# Patient Record
Sex: Female | Born: 1965 | Race: White | Hispanic: No | Marital: Married | Smoking: Never smoker
Health system: Southern US, Community
[De-identification: ages and names within clinical notes are randomized; demographics above are authoritative.]

## PROBLEM LIST (undated history)

## (undated) DIAGNOSIS — R569 Unspecified convulsions: Secondary | ICD-10-CM

## (undated) DIAGNOSIS — S069XAA Unspecified intracranial injury with loss of consciousness status unknown, initial encounter: Secondary | ICD-10-CM

## (undated) HISTORY — DX: Unspecified convulsions: R56.9

## (undated) HISTORY — PX: ABDOMINAL HYSTERECTOMY: SHX81

---
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
## 2000-02-27 ENCOUNTER — Other Ambulatory Visit: Admission: RE | Admit: 2000-02-27 | Discharge: 2000-02-27 | Payer: Self-pay | Admitting: Obstetrics and Gynecology

## 2001-03-11 ENCOUNTER — Other Ambulatory Visit: Admission: RE | Admit: 2001-03-11 | Discharge: 2001-03-11 | Payer: Self-pay | Admitting: Obstetrics and Gynecology

## 2007-02-09 ENCOUNTER — Encounter: Admission: RE | Admit: 2007-02-09 | Discharge: 2007-02-09 | Payer: Self-pay | Admitting: Obstetrics and Gynecology

## 2010-07-22 ENCOUNTER — Encounter: Payer: Self-pay | Admitting: Obstetrics and Gynecology

## 2014-11-16 DIAGNOSIS — R1013 Epigastric pain: Secondary | ICD-10-CM | POA: Insufficient documentation

## 2014-11-16 DIAGNOSIS — R1030 Lower abdominal pain, unspecified: Secondary | ICD-10-CM | POA: Insufficient documentation

## 2015-02-06 DIAGNOSIS — Z79899 Other long term (current) drug therapy: Secondary | ICD-10-CM | POA: Insufficient documentation

## 2015-02-06 DIAGNOSIS — K219 Gastro-esophageal reflux disease without esophagitis: Secondary | ICD-10-CM | POA: Insufficient documentation

## 2017-02-03 DIAGNOSIS — M20012 Mallet finger of left finger(s): Secondary | ICD-10-CM | POA: Insufficient documentation

## 2017-02-03 DIAGNOSIS — G5603 Carpal tunnel syndrome, bilateral upper limbs: Secondary | ICD-10-CM | POA: Insufficient documentation

## 2017-03-30 ENCOUNTER — Encounter: Payer: Self-pay | Admitting: Podiatry

## 2017-03-30 ENCOUNTER — Encounter (INDEPENDENT_AMBULATORY_CARE_PROVIDER_SITE_OTHER): Payer: Self-pay

## 2017-03-30 ENCOUNTER — Ambulatory Visit (INDEPENDENT_AMBULATORY_CARE_PROVIDER_SITE_OTHER): Payer: 59

## 2017-03-30 ENCOUNTER — Ambulatory Visit (INDEPENDENT_AMBULATORY_CARE_PROVIDER_SITE_OTHER): Payer: 59 | Admitting: Podiatry

## 2017-03-30 VITALS — BP 104/74 | HR 84 | Ht 66.0 in | Wt 220.0 lb

## 2017-03-30 DIAGNOSIS — M722 Plantar fascial fibromatosis: Secondary | ICD-10-CM

## 2017-03-30 NOTE — Progress Notes (Signed)
   Subjective:    Patient ID: Alexis Woods, female    DOB: 02-19-1966, 51 y.o.   MRN: [id number]  HPI  Right arch pain since July.   51 y.o. female presents with the above complaint. Reports pain with the R heel/arch worst in the AM and after periods of inactivity. Pain described as soreness. Denies other pedal issues.  No past medical history on file. No past surgical history on file.  Current Outpatient Prescriptions:  .  B Complex Vitamins (VITAMIN-B COMPLEX) TABS, Take by mouth., Disp: , Rfl:  .  ferrous sulfate 325 (65 FE) MG tablet, Take 325 mg by mouth., Disp: , Rfl:  .  omeprazole (PRILOSEC) 10 MG capsule, Take 10 mg by mouth., Disp: , Rfl:  .  fluconazole (DIFLUCAN) 150 MG tablet, Take 1 tablet (150 mg total) by mouth once a week., Disp: 6 tablet, Rfl: 1 .  meloxicam (MOBIC) 15 MG tablet, Take 1 tablet (15 mg total) by mouth daily., Disp: 30 tablet, Rfl: 0  Allergies  Allergen Reactions  . Sertraline Nausea And Vomiting     Review of Systems  Musculoskeletal: Positive for arthralgias.  All other systems reviewed and are negative.      Objective:   Physical Exam Vitals:   03/30/17 1627  BP: 104/74  Pulse: 84   General AA&O x3. Normal mood and affect.  Vascular Dorsalis pedis and posterior tibial pulses  present 2+ bilaterally  Capillary refill normal to all digits. Pedal hair growth normal.  Neurologic Epicritic sensation grossly present bilaterally.  Dermatologic No open lesions. Interspaces clear of maceration. Nails well groomed and normal in appearance.  Orthopedic: MMT 5/5 in dorsiflexion, plantarflexion, inversion, and eversion bilaterally. Tender to palpation at the calcaneal tuber right. No pain with calcaneal squeeze right. Ankle ROM diminished range of motion right. Silfverskiold Test: negative right.   Radiographs: Taken and reviewed. No acute fractures. No evidence of calcaneal stress fracture.    Assessment & Plan:  Patient was evaluated and  treated and all questions answered.  Plantar Fasciitis, right - XR reviewed as above.  - Educated on icing and stretching. Instructions given.  - Injection consisting of 1cc 0.5 % Marcaine plain, delivered to the plantar fascia. - Rx Meloxicam - Will consider orthotics if pain fails to improve.  Procedure: Injection Tendon/Ligament Location: Right plantar fascia at the glabrous junction; medial approach. Skin Prep: Alcohol. Injectate: 1 cc 0.5% marcaine plain, 1 cc dexamethasone phosphate, 0.5 cc kenalog 10. Disposition: Patient tolerated procedure well. Injection site dressed with a band-aid.

## 2017-04-06 ENCOUNTER — Ambulatory Visit
Admission: RE | Admit: 2017-04-06 | Discharge: 2017-04-06 | Disposition: A | Discharge status: Home / Self Care | Payer: Self-pay | Source: Ambulatory Visit | Attending: Physician Assistant | Admitting: Physician Assistant

## 2017-04-06 ENCOUNTER — Telehealth: Payer: Self-pay | Admitting: Podiatry

## 2017-04-06 ENCOUNTER — Other Ambulatory Visit: Payer: Self-pay | Admitting: Physician Assistant

## 2017-04-06 DIAGNOSIS — R1084 Generalized abdominal pain: Secondary | ICD-10-CM

## 2017-04-06 DIAGNOSIS — K5900 Constipation, unspecified: Secondary | ICD-10-CM

## 2017-04-06 MED ORDER — MELOXICAM 15 MG PO TABS: 15.0000 mg | ORAL_TABLET | Freq: Every day | ORAL | 0 refills | Status: DC

## 2017-04-06 MED ORDER — FLUCONAZOLE 150 MG PO TABS: 150.0000 mg | ORAL_TABLET | ORAL | 1 refills | Status: DC

## 2017-04-06 NOTE — Telephone Encounter (Signed)
I saw Dr. Samuella Cota last Monday 01 October in the [place] office. He was supposed to prescribe some medications and none of those were sent to [facility] pharmacy. I checked with them twice and they never had anything. One is supposed to be for athlete's foot, something to take orally for toenail fungus, and the important thing is for the inflammation for my plantar fasciitis. If it could get called in I can go ahead and keep my appointment for two and a half weeks. You can reach me at [phone]. Thanks. Bye.

## 2017-04-06 NOTE — Telephone Encounter (Signed)
Called patient and apologized on Dr Price's behalf let her know she should be able to go pick up her prescriptions now.  Asked her to call me back if she had any additional questions or concerns.

## 2017-04-06 NOTE — Addendum Note (Signed)
Addended by: Ventura Sellers on: 04/06/2017 11:43 AM   Modules accepted: Orders

## 2017-04-06 NOTE — Telephone Encounter (Signed)
Medications that were prescribed at last visit were never received at the pharmacy and she never received the stretch information that you discussed with her.  Meds can be called in to [facility].

## 2017-04-21 ENCOUNTER — Ambulatory Visit: Payer: 59 | Admitting: Podiatry

## 2017-05-05 ENCOUNTER — Encounter (INDEPENDENT_AMBULATORY_CARE_PROVIDER_SITE_OTHER): Payer: Self-pay

## 2017-05-05 ENCOUNTER — Ambulatory Visit (INDEPENDENT_AMBULATORY_CARE_PROVIDER_SITE_OTHER): Payer: 59 | Admitting: Podiatry

## 2017-05-05 ENCOUNTER — Encounter: Payer: Self-pay | Admitting: Podiatry

## 2017-05-05 DIAGNOSIS — M722 Plantar fascial fibromatosis: Secondary | ICD-10-CM

## 2017-05-05 NOTE — Progress Notes (Signed)
  Subjective:  Patient ID: Alexis Woods, female    DOB: 09/10/1965,  MRN: [id number]  Chief Complaint  Patient presents with  . Plantar Fasciitis    right foot feels some what better. Still hurts at the end of the day. Icing has helped a lot.   51 y.o. female returns for the above complaint.  States that she still hurts somewhat at the end of the day.  Icing has helped a lot and she ices at the end of the day.  Still wearing her plantar fascial brace.  Objective:  There were no vitals filed for this visit. General AA&O x3. Normal mood and affect.  Vascular Pedal pulses palpable.  Neurologic Epicritic sensation grossly intact.  Dermatologic No open lesions. Skin normal texture and turgor.  Orthopedic: Light tenderness to palpation R foot.   Assessment & Plan:  Patient was evaluated and treated and all questions answered.  Plantar fasciitis, R -Almost resolved. Pain only 1/10 today. -No injection today. -Scanned for orthotics; checked with insurance - won't be covered. -Will f/u in 4 weeks, if pain persists will plan for fabrication of custom orthotics.  Return in about 4 weeks (around 06/02/2017) for Plantar fasciitis.

## 2017-06-16 ENCOUNTER — Ambulatory Visit: Payer: 59 | Admitting: Podiatry

## 2017-09-21 ENCOUNTER — Other Ambulatory Visit: Payer: Self-pay | Admitting: Physician Assistant

## 2017-09-21 DIAGNOSIS — R1011 Right upper quadrant pain: Secondary | ICD-10-CM

## 2017-09-21 DIAGNOSIS — R1012 Left upper quadrant pain: Secondary | ICD-10-CM

## 2017-09-21 DIAGNOSIS — R1013 Epigastric pain: Secondary | ICD-10-CM

## 2017-09-29 ENCOUNTER — Ambulatory Visit
Admission: RE | Admit: 2017-09-29 | Discharge: 2017-09-29 | Disposition: A | Discharge status: Home / Self Care | Payer: 59 | Source: Ambulatory Visit | Attending: Physician Assistant | Admitting: Physician Assistant

## 2017-09-29 DIAGNOSIS — R1013 Epigastric pain: Secondary | ICD-10-CM

## 2017-09-29 DIAGNOSIS — R1011 Right upper quadrant pain: Secondary | ICD-10-CM

## 2017-09-29 DIAGNOSIS — R1012 Left upper quadrant pain: Secondary | ICD-10-CM

## 2017-10-07 ENCOUNTER — Other Ambulatory Visit (HOSPITAL_COMMUNITY): Payer: Self-pay | Admitting: Physician Assistant

## 2017-10-07 DIAGNOSIS — R1011 Right upper quadrant pain: Secondary | ICD-10-CM

## 2017-10-07 DIAGNOSIS — R1013 Epigastric pain: Secondary | ICD-10-CM

## 2017-10-07 DIAGNOSIS — R112 Nausea with vomiting, unspecified: Secondary | ICD-10-CM

## 2017-10-07 DIAGNOSIS — R634 Abnormal weight loss: Secondary | ICD-10-CM

## 2017-10-09 ENCOUNTER — Ambulatory Visit (HOSPITAL_BASED_OUTPATIENT_CLINIC_OR_DEPARTMENT_OTHER)
Admission: RE | Admit: 2017-10-09 | Discharge: 2017-10-09 | Disposition: A | Discharge status: Home / Self Care | Payer: 59 | Source: Ambulatory Visit | Attending: Physician Assistant | Admitting: Physician Assistant

## 2017-10-09 ENCOUNTER — Encounter (HOSPITAL_BASED_OUTPATIENT_CLINIC_OR_DEPARTMENT_OTHER): Payer: Self-pay

## 2017-10-09 DIAGNOSIS — R112 Nausea with vomiting, unspecified: Secondary | ICD-10-CM | POA: Diagnosis present

## 2017-10-09 DIAGNOSIS — K5790 Diverticulosis of intestine, part unspecified, without perforation or abscess without bleeding: Secondary | ICD-10-CM | POA: Insufficient documentation

## 2017-10-09 DIAGNOSIS — R634 Abnormal weight loss: Secondary | ICD-10-CM | POA: Diagnosis not present

## 2017-10-09 DIAGNOSIS — R1011 Right upper quadrant pain: Secondary | ICD-10-CM | POA: Diagnosis present

## 2017-10-09 DIAGNOSIS — R1013 Epigastric pain: Secondary | ICD-10-CM | POA: Diagnosis present

## 2017-10-09 MED ORDER — IOPAMIDOL (ISOVUE-300) INJECTION 61%
100.0000 mL | Freq: Once | INTRAVENOUS | Status: AC | PRN
  Administered 2017-10-09: 100 mL via INTRAVENOUS

## 2017-10-16 ENCOUNTER — Encounter (HOSPITAL_COMMUNITY)
Admission: RE | Admit: 2017-10-16 | Discharge: 2017-10-16 | Disposition: A | Discharge status: Home / Self Care | Payer: 59 | Source: Ambulatory Visit | Attending: Physician Assistant | Admitting: Physician Assistant

## 2017-10-16 DIAGNOSIS — R1011 Right upper quadrant pain: Secondary | ICD-10-CM | POA: Diagnosis present

## 2017-10-16 DIAGNOSIS — R112 Nausea with vomiting, unspecified: Secondary | ICD-10-CM | POA: Diagnosis present

## 2017-10-16 MED ORDER — TECHNETIUM TC 99M MEBROFENIN IV KIT
5.1000 | PACK | Freq: Once | INTRAVENOUS | Status: AC | PRN
  Administered 2017-10-16: 5.1 via INTRAVENOUS

## 2019-08-01 ENCOUNTER — Other Ambulatory Visit: Payer: Self-pay

## 2019-08-01 ENCOUNTER — Encounter: Payer: Self-pay | Admitting: Podiatry

## 2019-08-01 ENCOUNTER — Ambulatory Visit (INDEPENDENT_AMBULATORY_CARE_PROVIDER_SITE_OTHER): Payer: 59 | Admitting: Podiatry

## 2019-08-01 DIAGNOSIS — B351 Tinea unguium: Secondary | ICD-10-CM | POA: Diagnosis not present

## 2019-08-01 DIAGNOSIS — M722 Plantar fascial fibromatosis: Secondary | ICD-10-CM

## 2019-08-01 DIAGNOSIS — Z79899 Other long term (current) drug therapy: Secondary | ICD-10-CM | POA: Diagnosis not present

## 2019-08-01 DIAGNOSIS — K760 Fatty (change of) liver, not elsewhere classified: Secondary | ICD-10-CM | POA: Insufficient documentation

## 2019-08-01 MED ORDER — TERBINAFINE HCL 250 MG PO TABS: 250.0000 mg | ORAL_TABLET | Freq: Every day | ORAL | 2 refills | Status: DC

## 2019-08-01 NOTE — Progress Notes (Signed)
  Subjective:  Patient ID: Alexis Woods, female    DOB: [birth year],  MRN: [id number]  Chief Complaint  Patient presents with  . Nail Problem    BL toeniasl (1-2 and 4th-5th) thick , disolored  x 4-5 years    54 y.o. female presents with the above complaint. History confirmed with patient.   Objective:  Physical Exam: warm, good capillary refill, nail exam onychomycosis of the toenails, no trophic changes or ulcerative lesions, normal DP and PT pulses and normal sensory exam.  Assessment:   1. Onychomycosis   2. Encounter for long-term (current) use of high-risk medication    Plan:  Patient was evaluated and treated and all questions answered.  Onychomycosis -Educated on etiology of nail fungus. -Baseline liver function studies ordered. Will d/c terbinafine if elevated during therapy. -eRx for oral terbinafine #30. Educated on risks and benefits of the medication.  Plantar Fasciitis -Casted for CMOs  Return in about 5 weeks (around 09/05/2019) for Nail Fungus.

## 2019-08-02 LAB — HEPATIC FUNCTION PANEL
ALT: 30 IU/L (ref 0–32)
AST: 35 IU/L (ref 0–40)
Albumin: 4.8 g/dL (ref 3.8–4.9)
Alkaline Phosphatase: 86 IU/L (ref 39–117)
Bilirubin Total: 0.4 mg/dL (ref 0.0–1.2)
Bilirubin, Direct: 0.1 mg/dL (ref 0.00–0.40)
Total Protein: 7.2 g/dL (ref 6.0–8.5)

## 2019-08-05 ENCOUNTER — Telehealth: Payer: Self-pay | Admitting: Podiatry

## 2019-08-05 NOTE — Telephone Encounter (Signed)
Discussed orthotics coverage and opt is aware and is wanting to proceed with the orthotics

## 2019-08-05 NOTE — Telephone Encounter (Signed)
Left message for pt to call to discuss orthotic coverage. 

## 2019-08-11 NOTE — Telephone Encounter (Signed)
Orthotics scanned submitted to [facility]

## 2019-09-05 ENCOUNTER — Ambulatory Visit (INDEPENDENT_AMBULATORY_CARE_PROVIDER_SITE_OTHER): Payer: 59 | Admitting: Podiatry

## 2019-09-05 DIAGNOSIS — Z5329 Procedure and treatment not carried out because of patient's decision for other reasons: Secondary | ICD-10-CM

## 2019-09-05 NOTE — Progress Notes (Signed)
No show for appt. 

## 2019-10-31 ENCOUNTER — Telehealth: Payer: Self-pay

## 2019-10-31 NOTE — Telephone Encounter (Signed)
Pt called stating she had received her covid shot weeks ago but started experiencing a lot of health issues, hip pain and diarrhea. Pt states she would like to DC the oral terbinafine for right now and if she can get a liver blood order, because she is worried about any liver disfunction/problem. Please advice

## 2019-11-14 ENCOUNTER — Ambulatory Visit (INDEPENDENT_AMBULATORY_CARE_PROVIDER_SITE_OTHER): Payer: 59 | Admitting: Podiatry

## 2019-11-14 ENCOUNTER — Encounter: Payer: Self-pay | Admitting: Podiatry

## 2019-11-14 ENCOUNTER — Other Ambulatory Visit: Payer: Self-pay

## 2019-11-14 DIAGNOSIS — B351 Tinea unguium: Secondary | ICD-10-CM | POA: Diagnosis not present

## 2019-11-14 DIAGNOSIS — M722 Plantar fascial fibromatosis: Secondary | ICD-10-CM

## 2019-11-14 MED ORDER — FLUCONAZOLE 150 MG PO TABS: 150.0000 mg | ORAL_TABLET | ORAL | 1 refills | Status: DC

## 2019-11-14 NOTE — Progress Notes (Signed)
  Subjective:  Patient ID: Alexis Woods, female    DOB: [birth year],  MRN: [id number]  Chief Complaint  Patient presents with  . Nail Problem    the nails are doing ok and i would have liked them to be further along and i want to talk about laser   54 y.o. female presents with the above complaint. History confirmed with patient.   Objective:  Physical Exam: warm, good capillary refill, nail exam onychomycosis of the toenails, no trophic changes or ulcerative lesions, normal DP and PT pulses and normal sensory exam.  Assessment:   1. Onychomycosis   2. Plantar fasciitis    Plan:  Patient was evaluated and treated and all questions answered.  Onychomycosis -Switch to fluoconazole for failure of therapy. -Trial laser.  Plantar Fasciitis -CMOs dispensed. Trimmed to fit shoes.  Return in about 2 months (around 01/14/2020) for Nail Fungus.

## 2019-11-15 DIAGNOSIS — M5431 Sciatica, right side: Secondary | ICD-10-CM | POA: Insufficient documentation

## 2019-11-24 ENCOUNTER — Other Ambulatory Visit: Payer: Self-pay

## 2019-11-24 MED ORDER — TERBINAFINE HCL 250 MG PO TABS: 250.0000 mg | ORAL_TABLET | Freq: Every day | ORAL | 2 refills | Status: DC

## 2019-12-12 ENCOUNTER — Other Ambulatory Visit: Payer: 59

## 2020-01-16 ENCOUNTER — Ambulatory Visit: Payer: 59 | Admitting: Podiatry

## 2020-01-23 ENCOUNTER — Other Ambulatory Visit: Payer: 59

## 2020-01-23 ENCOUNTER — Ambulatory Visit (INDEPENDENT_AMBULATORY_CARE_PROVIDER_SITE_OTHER): Payer: 59 | Admitting: Podiatry

## 2020-01-23 DIAGNOSIS — Z5329 Procedure and treatment not carried out because of patient's decision for other reasons: Secondary | ICD-10-CM

## 2020-01-23 NOTE — Progress Notes (Signed)
No show for appt. 

## 2020-02-27 DIAGNOSIS — R55 Syncope and collapse: Secondary | ICD-10-CM

## 2020-03-16 ENCOUNTER — Other Ambulatory Visit: Payer: Self-pay | Admitting: Neurology

## 2020-03-16 ENCOUNTER — Encounter: Payer: Self-pay | Admitting: Neurology

## 2020-03-19 ENCOUNTER — Other Ambulatory Visit: Payer: Self-pay

## 2020-03-19 ENCOUNTER — Ambulatory Visit: Payer: 59 | Admitting: Neurology

## 2020-03-19 ENCOUNTER — Encounter: Payer: Self-pay | Admitting: Neurology

## 2020-03-19 VITALS — BP 99/69 | HR 45 | Ht 66.0 in | Wt 151.0 lb

## 2020-03-19 DIAGNOSIS — S069X2A Unspecified intracranial injury with loss of consciousness of 31 minutes to 59 minutes, initial encounter: Secondary | ICD-10-CM | POA: Diagnosis not present

## 2020-03-19 DIAGNOSIS — G44301 Post-traumatic headache, unspecified, intractable: Secondary | ICD-10-CM

## 2020-03-19 DIAGNOSIS — S0993XA Unspecified injury of face, initial encounter: Secondary | ICD-10-CM

## 2020-03-19 DIAGNOSIS — R412 Retrograde amnesia: Secondary | ICD-10-CM | POA: Diagnosis not present

## 2020-03-19 DIAGNOSIS — S069XAA Unspecified intracranial injury with loss of consciousness status unknown, initial encounter: Secondary | ICD-10-CM | POA: Insufficient documentation

## 2020-03-19 DIAGNOSIS — R561 Post traumatic seizures: Secondary | ICD-10-CM | POA: Insufficient documentation

## 2020-03-19 MED ORDER — GABAPENTIN 100 MG PO CAPS: 100.0000 mg | ORAL_CAPSULE | Freq: Two times a day (BID) | ORAL | 5 refills | Status: DC

## 2020-03-19 MED ORDER — LEVETIRACETAM 500 MG PO TABS: 500.0000 mg | ORAL_TABLET | Freq: Two times a day (BID) | ORAL | 5 refills | Status: DC

## 2020-03-19 MED ORDER — DONEPEZIL HCL 5 MG PO TABS: 5.0000 mg | ORAL_TABLET | Freq: Every day | ORAL | 0 refills | Status: DC

## 2020-03-19 NOTE — Progress Notes (Signed)
Provider:  Melvyn Novasarmen  Alexis Yeatman, MD  Primary Care Physician:  Everlean CherryWhyte, Thomas M, MD [address] [place] [id number]     Referring Provider: Everlean CherryWhyte, Thomas M, Md [address] [place] [id number]          Chief Complaint according to patient   Patient presents with:    . New Patient (Initial Visit)     pt with, rm 10. sun 8/29 she was walking in [employer] and someone walked out the same time and witness she fell and hit her head, + LOC and was admitted to hospital til 9/2 and dc home. Sat 9/4 11:30 pm sz in bed and had another one in ER at check in. she was dc on keppra at that time. prior to this never had a hx of sz      Other only thing she can think of that was new leading up is she initially started a Ryze mushroom coffee, which has no caffeine         HISTORY OF PRESENT ILLNESS:  Alexis Woods is a [age]. year old  Caucasian female patient seen here as a referral on 03/19/2020 from Dr. Arlan Woods, Thomas, MD, [facility]  .  Chief concern according to patient :   Mrs. Alexis PeersYork went to a store in her usual good health on Sunday, 29 August and she has no memory but occurred next but she was witnessed to have fallen and hit her head.  A smart phone picture that her husband took shows that her right face is very bruised and a braised and she also reports that she had not all over her scalp the right face and right scalp were very tender.  She was admitted to the hospital and stayed there until 2 September then discharged home. She had broken her nose.  She remained with  retrograde amnesia patchy memory and difficulties retaining new memory.  Her on Friday following she had projectile vomiting at home only and and by Saturday, 4 September at about 11:30 PM she suffered a seizure in bed which was witnessed by her husband. Returning to the Emergency room.   She was returning to the emergency room where she had another witnessed seizure- this time she was started  on Keppra.  Prior to her head injury she never had suffered a seizure.  There is no family history of epilepsy.  She has no previous  history of any traumatic brain injuries. She recently has had a UTI, and she has been drinking mushroom coffee. She was sleep derived for planning a trip.     I have the pleasure of seeing Alexis Woods today, a right -handed White or Caucasian female with a possible sleep disorder.  She has suffered a TBI-   has a past medical history of Seizure (HCC).     Social history:  Patient is working as a radio show host ,  The patient currently works par time.  Tobacco use: none.  ETOH use; 1/month, Caffeine intake in form of Coffee( now quit ) Regular exercise in form of running, burpees..   Hobbies : radio, robotics.        Review of Systems: Out of a complete 14 system review, the patient complains of only the following symptoms, and all other reviewed systems are negative.:   retrograde amnesia,  headaches posttraumatic, skull and dental pain- facial pain, nasal fracture. Orbital  bottom fracture.    Social History   Socioeconomic History  . Marital status: Married    Spouse name: Not on file  . Number of children: Not on file  . Years of education: Not on file  . Highest education level: Not on file  Occupational History  . Not on file  Tobacco Use  . Smoking status: Never Smoker  . Smokeless tobacco: Never Used  Substance and Sexual Activity  . Alcohol use: Not Currently  . Drug use: Not Currently  . Sexual activity: Not on file  Other Topics Concern  . Not on file  Social History Narrative  . Not on file   Social Determinants of Health   Financial Resource Strain:   . Difficulty of Paying Living Expenses: Not on file  Food Insecurity:   . Worried About Programme researcher, broadcasting/film/video in the Last Year: Not on file  . Ran Out of Food in the Last Year: Not on file  Transportation Needs:   . Lack of Transportation (Medical): Not on file  . Lack of  Transportation (Non-Medical): Not on file  Physical Activity:   . Days of Exercise per Week: Not on file  . Minutes of Exercise per Session: Not on file  Stress:   . Feeling of Stress : Not on file  Social Connections:   . Frequency of Communication with Friends and Family: Not on file  . Frequency of Social Gatherings with Friends and Family: Not on file  . Attends Religious Services: Not on file  . Active Member of Clubs or Organizations: Not on file  . Attends Banker Meetings: Not on file  . Marital Status: Not on file    No family history on file.  Past Medical History:  Diagnosis Date  . Seizure [facility])     Past Surgical History:  Procedure Laterality Date  . ABDOMINAL HYSTERECTOMY    . CLEFT LIP REPAIR  1987     Current Outpatient Medications on File Prior to Visit  Medication Sig Dispense Refill  . acetaminophen (TYLENOL) 325 MG tablet Take by mouth.    . B Complex Vitamins (VITAMIN-B COMPLEX) TABS Take by mouth.    . cyclobenzaprine (FLEXERIL) 5 MG tablet Take 5 mg by mouth every 8 (eight) hours as needed.    . Homeopathic Products ([facility]) GEL Apply topically in the morning, at noon, in the evening, and at bedtime.    . levETIRAcetam (KEPPRA) 500 MG tablet Take 500 mg by mouth 2 (two) times daily.     Marland Kitchen MAGNESIUM GLYCINATE PO Take 500 mg by mouth daily.    . Omega-3 1000 MG CAPS Take 1 capsule by mouth daily.     Marland Kitchen OVER THE COUNTER MEDICATION 1 tablet daily. Curamed.    Marland Kitchen VITAMIN D, ERGOCALCIFEROL, PO Take 1 tablet by mouth daily.    Marland Kitchen VITAMIN E PO Take 1 tablet by mouth daily.     No current facility-administered medications on file prior to visit.    Allergies  Allergen Reactions  . Sertraline Nausea And Vomiting    Physical exam:  Today's Vitals   03/19/20 1410  BP: 99/69  Pulse: (!) 45  Weight: 151 lb (68.5 kg)  Height: 5\' 6"  (1.676 Woods)   Body mass index is 24.37 kg/Woods.   Wt Readings from Last 3 Encounters:  03/19/20 151 lb (68.5  kg)  10/16/17 202 lb (91.6 kg)  03/30/17 220 lb (99.8 kg)     Ht Readings from  Last 3 Encounters:  03/19/20 5\' 6"  (1.676 Woods)  03/30/17 5\' 6"  (1.676 Woods)      General: The patient is awake, alert and appears not in acute distress. The patient is well groomed. Head: Normocephalic, but traumatic. Clef lip -  Neck is supple. Mallampati ,  Cardiovascular:  Regular rate and cardiac rhythm by pulse,  without distended neck veins. Respiratory: Lungs are clear to auscultation.  Skin:  Without evidence of ankle edema, or rash. Trunk: The patient's posture is erect.   Neurologic exam : The patient is awake and alert, oriented to place and time.   Memory subjective described as intact.  Attention span & concentration ability appears normal.  Speech is fluent,  without  dysarthria, dysphonia or aphasia.  Mood and affect are appropriate.   Cranial nerves: no loss of smell or taste reported  Pupils are equal and briskly reactive to light. Funduscopic exam revealed no oedema or palor or bleed. 05/30/17  Extraocular movements in vertical and horizontal planes were intact and without nystagmus. No Diplopia. Visual fields by finger perimetry are intact. Hearing was intact to soft voice and finger rubbing.    Facial sensation reduced to fine touch though out her right face- right scalp, right orbit. .  Facial motor strength is symmetric and tongue and uvula move midline.  Neck ROM : rotation, tilt and flexion extension were normal for age and shoulder shrug was symmetrical.    Motor exam:  Symmetric bulk, tone and ROM.   Normal tone without cog wheeling, symmetric grip strength .   Sensory:  Fine touch, pinprick and vibration were tested  and  normal.  Proprioception tested in the upper extremities was normal.   Coordination: Rapid alternating movements in the fingers/hands were of normal speed.  The Finger-to-nose maneuver was intact without evidence of ataxia, dysmetria or tremor.   Gait and station:  Patient could rise unassisted from a seated position, walked without assistive device.  Stance is of normal width/ base and the patient turned with 3 steps.  Toe and heel walk were deferred.  Deep tendon reflexes: in the  upper and lower extremities are symmetric and intact. Patella was very brisk.  Babinski response was deferred .        After spending a total time of  45  minutes face to face -mostly time for physical and neurologic examination, and additional 15- 20 minutes-  review of laboratory studies,  personal review of imaging studies, reports and results of other testing and review of referral information / records as far as provided in visit, I have established the following assessments:  1) TBI with vomiting , LOC and post concussion , still acute phase.  The patient has the classic symptoms of amnestic memory lapses for at least minutes before she ever entered the store in which she finally fell and hit her head, she is not sure how she got there or why she got that.  She has had some short-term memory difficulty since but they are improving.  She has had 2 seizures post traumatic.  She continues to have posttraumatic headaches scalp tenderness and looking at the documented bony injuries I expect her to have this for another 6 to 8 weeks improving but still noticeable.  I am very happy to see that her ocular movements are intact there is no nystagmus, and no evidence of retro-orbital bleed.    My Plan is to proceed with:  1) MRI brain with and without contrast in  14 days, looking at edematous changes since she underwent CT head study.  2) EEG ordered, we will have some artefact for the next 2-3 weeks.  3) Keppra to be continued - 500 mg bid.  The patient can return to work with 6 month  driving restriction- and restrictions to operate machinery.  I will start her on aricept which has anecdotal evidence of improving memory in TBI patient.  Facial and dental pain to be treated with  low dose gabapentin.    I would like to thank  Everlean Cherry, Md [address] [place],  [place] [address] for allowing me to meet with and to take care of this pleasant patient.   In short, SHAKHIA GRAMAJO is presenting with a concussion and  I plan to follow up either personally or through our NP within 2-3 month.   CC: I will share my notes with PCP   Electronically signed by: Melvyn Novas, MD 03/19/2020 2:38 PM  [facility] Sleep Board certified by The [employer] of Sleep Medicine and Diplomate of the [employer] of Sleep Medicine. Board certified In Neurology through the ABPN, Fellow of the [employer] of Neurology. Medical Director of [employer].

## 2020-03-19 NOTE — Patient Instructions (Signed)
Traumatic Brain Injury Traumatic brain injury (TBI) is an injury to the brain that results from:  A hard, direct blow to the head (closed injury).  An object penetrating the skull and entering the brain (open injury). Traumatic brain injury is also called a head injury or a concussion. TBI can be mild, moderate, or severe. What are the causes? Common causes of this condition include:  Falls.  Motor vehicle accidents.  Sports injuries.  Assaults. What increases the risk? You are more likely to develop this condition if you:  Are 75 years old or older.  Are a man.  Play contact sports, especially football, hockey, or soccer.  Are in the military.  Are a victim of violence.  Abuse drugs or alcohol.  Have had a previous TBI. What are the signs or symptoms? Symptoms may vary from person to person, and may include:  Loss of consciousness.  Headache.  Confusion.  Fatigue.  Changes in sleep.  Dizziness.  Mood or personality changes.  Memory problems.  Nausea or vomiting or both.  Seizures.  Clumsiness.  Slurred speech.  Depression and anxiety.  Anger.  Trouble concentrating, organizing, or making decisions.  Inability to control emotions or actions (impulse control).  Loss of or dulling of the senses, such as hearing, vision, and touch. This can include: ? Blurred vision. ? Ringing in your ears. How is this diagnosed? This condition may be diagnosed based on:  Medical history and physical exam.  Neurologic exam. This checks for brain and nervous system function, including your reflexes, memory, and coordination.  CT scan. Your TBI may be described as mild, moderate, or severe. How is this treated? Treatment depends on the severity of your brain injury and may include:  Breathing support (mechanical ventilation).  Blood pressure medicines.  Pain medicines.  Treatments to decrease the swelling in your brain.  Brain surgery. This may be  needed to: ? Remove a blood clot. ? Repair bleeding. ? Remove an object that has penetrated the brain, such as a skull fragment or a bullet. Treatment of TBI also includes:  Physical and mental rest.  Careful observation.  Medicine. You may be prescribed medicines to help with symptoms such as headaches, nausea, or difficulty sleeping.  Physical, occupational, and speech therapy.  Referral to a concussion clinic or rehabilitation center. Follow these instructions at home: Medicines  Take over-the-counter and prescription medicines only as told by your health care provider.  Do not take blood thinners (anticoagulants), aspirin, or other anti-inflammatory medicines such as ibuprofen or naproxen unless approved by your health care provider. Activity  Rest. Rest helps the brain to heal. Make sure you: ? Get plenty of sleep. Most adults should get 7-9 hours of sleep each night. ? Rest during the day. Take daytime naps or rest breaks when you feel tired.  Do not do high-risk activities that could cause a second concussion, such as riding a bike or playing sports. Having another concussion before the first one has healed can be dangerous.  Avoid a lot of visual stimulation. This includes work on the computer or phone, watching TV, and reading.  Ask your health care provider what kind of activities are safe for you. Your ability to react may be slower after a brain injury. Never do these activities if you are dizzy. Your health care provider will likely give you a plan for gradually returning to activities. General instructions  Do not drink alcohol.  Watch your symptoms and tell others to do the   same. Complications sometimes occur after a brain injury. Older adults with a brain injury may have a higher risk of serious complications.  Seek support from friends and family.  Keep all follow-up visits as directed by your health care provider. This is important. Contact a health care  provider if:  Your symptoms get worse or they do not improve.  You have new symptoms.  You have another injury. Get help right away if:  You have: ? Severe, persistent headaches that are not relieved by medicine. ? Weakness or numbness in any part of your body. ? Confusion. ? Slurred speech. ? Difficulty waking up. ? Nausea or persistent vomiting. ? A feeling like you are moving when you are not (vertigo). ? Seizures or you faint. ? Changes in your vision. ? Clear or bloody discharge from your nose or ears.  You cannot use your arms or legs normally. Summary  Traumatic brain injury happens when there is a hard, direct blow to the head or when an object penetrates the skull and enters the brain.  Traumatic brain injuries may be mild, moderate, or severe. Treatment depends on the severity of your injury.  Get help right away if you have a head injury and you develop seizures, confusion, vomiting, weakness in the arms or legs, slurred speech, and other symptoms.  Rest is one of the best treatments. Do not return to activity until your health care provider approves. This information is not intended to replace advice given to you by your health care provider. Make sure you discuss any questions you have with your health care provider. Document Revised: 10/21/2017 Document Reviewed: 08/03/2017 Elsevier Patient Education  2020 Elsevier [employer] Gabapentin capsules or tablets What is this medicine? GABAPENTIN (GA ba pen tin) is used to control seizures in certain types of epilepsy. It is also used to treat certain types of nerve pain. This medicine may be used for other purposes; ask your health care provider or pharmacist if you have questions. COMMON BRAND NAME(S): Active-PAC with Gabapentin, Gabarone, Neurontin What should I tell my health care provider before I take this medicine? They need to know if you have any of these conditions:  history of drug abuse or alcohol abuse  problem  kidney disease  lung or breathing disease  suicidal thoughts, plans, or attempt; a previous suicide attempt by you or a family member  an unusual or allergic reaction to gabapentin, other medicines, foods, dyes, or preservatives  pregnant or trying to get pregnant  breast-feeding How should I use this medicine? Take this medicine by mouth with a glass of water. Follow the directions on the prescription label. You can take it with or without food. If it upsets your stomach, take it with food. Take your medicine at regular intervals. Do not take it more often than directed. Do not stop taking except on your doctor's advice. If you are directed to break the 600 or 800 mg tablets in half as part of your dose, the extra half tablet should be used for the next dose. If you have not used the extra half tablet within 28 days, it should be thrown away. A special MedGuide will be given to you by the pharmacist with each prescription and refill. Be sure to read this information carefully each time. Talk to your pediatrician regarding the use of this medicine in children. While this drug may be prescribed for children as young as 3 years for selected conditions, precautions do apply. Overdosage: If you think  you have taken too much of this medicine contact a poison control center or emergency room at once. NOTE: This medicine is only for you. Do not share this medicine with others. What if I miss a dose? If you miss a dose, take it as soon as you can. If it is almost time for your next dose, take only that dose. Do not take double or extra doses. What may interact with this medicine? This medicine may interact with the following medications:  alcohol  antihistamines for allergy, cough, and cold  certain medicines for anxiety or sleep  certain medicines for depression like amitriptyline, fluoxetine, sertraline  certain medicines for seizures like phenobarbital, primidone  certain  medicines for stomach problems  general anesthetics like halothane, isoflurane, methoxyflurane, propofol  local anesthetics like lidocaine, pramoxine, tetracaine  medicines that relax muscles for surgery  narcotic medicines for pain  phenothiazines like chlorpromazine, mesoridazine, prochlorperazine, thioridazine This list may not describe all possible interactions. Give your health care provider a list of all the medicines, herbs, non-prescription drugs, or dietary supplements you use. Also tell them if you smoke, drink alcohol, or use illegal drugs. Some items may interact with your medicine. What should I watch for while using this medicine? Visit your doctor or health care provider for regular checks on your progress. You may want to keep a record at home of how you feel your condition is responding to treatment. You may want to share this information with your doctor or health care provider at each visit. You should contact your doctor or health care provider if your seizures get worse or if you have any new types of seizures. Do not stop taking this medicine or any of your seizure medicines unless instructed by your doctor or health care provider. Stopping your medicine suddenly can increase your seizures or their severity. This medicine may cause serious skin reactions. They can happen weeks to months after starting the medicine. Contact your health care provider right away if you notice fevers or flu-like symptoms with a rash. The rash may be red or purple and then turn into blisters or peeling of the skin. Or, you might notice a red rash with swelling of the face, lips or lymph nodes in your neck or under your arms. Wear a medical identification bracelet or chain if you are taking this medicine for seizures, and carry a card that lists all your medications. You may get drowsy, dizzy, or have blurred vision. Do not drive, use machinery, or do anything that needs mental alertness until you  know how this medicine affects you. To reduce dizzy or fainting spells, do not sit or stand up quickly, especially if you are an older patient. Alcohol can increase drowsiness and dizziness. Avoid alcoholic drinks. Your mouth may get dry. Chewing sugarless gum or sucking hard candy, and drinking plenty of water will help. The use of this medicine may increase the chance of suicidal thoughts or actions. Pay special attention to how you are responding while on this medicine. Any worsening of mood, or thoughts of suicide or dying should be reported to your health care provider right away. Women who become pregnant while using this medicine may enroll in the [place] Antiepileptic Drug Pregnancy Registry by calling [phone]. This registry collects information about the safety of antiepileptic drug use during pregnancy. What side effects may I notice from receiving this medicine? Side effects that you should report to your doctor or health care professional as soon as possible:  allergic reactions like skin rash, itching or hives, swelling of the face, lips, or tongue  breathing problems  rash, fever, and swollen lymph nodes  redness, blistering, peeling or loosening of the skin, including inside the mouth  suicidal thoughts, mood changes Side effects that usually do not require medical attention (report to your doctor or health care professional if they continue or are bothersome):  dizziness  drowsiness  headache  nausea, vomiting  swelling of ankles, feet, hands  tiredness This list may not describe all possible side effects. Call your doctor for medical advice about side effects. You may report side effects to FDA at 1-800-FDA-1088. Where should I keep my medicine? Keep out of reach of children. This medicine may cause accidental overdose and death if it taken by other adults, children, or pets. Mix any unused medicine with a substance like cat litter or coffee grounds.  Then throw the medicine away in a sealed container like a sealed bag or a coffee can with a lid. Do not use the medicine after the expiration date. Store at room temperature between 15 and 30 degrees C (59 and 86 degrees F). NOTE: This sheet is a summary. It may not cover all possible information. If you have questions about this medicine, talk to your doctor, pharmacist, or health care provider.  2020 Elsevier/Gold Standard (2018-09-17 14:16:43)

## 2020-03-19 NOTE — Addendum Note (Signed)
Addended by: Melvyn Novas on: 03/19/2020 03:26 PM   Modules accepted: Orders

## 2020-03-20 ENCOUNTER — Telehealth: Payer: Self-pay | Admitting: Neurology

## 2020-03-20 NOTE — Telephone Encounter (Signed)
no to the covid questions MR Brain w/wo contrast Dr. Vickey Huger [facility] Auth: [id number] (exp. 03/20/20 to 05/04/20). Patient is scheduled at [facility] for 03/28/20.

## 2020-03-27 ENCOUNTER — Telehealth: Payer: Self-pay | Admitting: Neurology

## 2020-03-27 NOTE — Telephone Encounter (Signed)
Re: pt's levETIRAcetam (KEPPRA) 500 MG tablet gabapentin (NEURONTIN) 100 MG capsule, she is calling to report that the full does of the gabapentin helps with her pain.  Pt is asking if she should lower her Keppra dose since the gabapentin  Helps so well, please call.

## 2020-03-27 NOTE — Telephone Encounter (Signed)
Called the patient to advise her that keppra was being used for treatment of potential seizures and the gabapentin for pain she was having. Advised that typically we complete the further evaluation before determining whether can wean off keppra.  Pt wanted to make Dr Dohmeier aware that she is on the full dosage of the gabapentin and she feels both that and the aricept has been helpful.  Pt verbalized understanding.

## 2020-03-28 ENCOUNTER — Other Ambulatory Visit: Payer: Self-pay

## 2020-03-28 ENCOUNTER — Ambulatory Visit: Payer: 59

## 2020-03-28 DIAGNOSIS — S0993XA Unspecified injury of face, initial encounter: Secondary | ICD-10-CM

## 2020-03-28 DIAGNOSIS — R561 Post traumatic seizures: Secondary | ICD-10-CM

## 2020-03-28 DIAGNOSIS — R412 Retrograde amnesia: Secondary | ICD-10-CM

## 2020-03-28 DIAGNOSIS — S069X2A Unspecified intracranial injury with loss of consciousness of 31 minutes to 59 minutes, initial encounter: Secondary | ICD-10-CM | POA: Diagnosis not present

## 2020-03-28 DIAGNOSIS — G44301 Post-traumatic headache, unspecified, intractable: Secondary | ICD-10-CM

## 2020-03-28 MED ORDER — GADOBENATE DIMEGLUMINE 529 MG/ML IV SOLN
15.0000 mL | Freq: Once | INTRAVENOUS | Status: AC | PRN
  Administered 2020-03-28: 15 mL via INTRAVENOUS

## 2020-03-29 ENCOUNTER — Encounter: Payer: Self-pay | Admitting: Neurology

## 2020-03-29 ENCOUNTER — Telehealth: Payer: Self-pay | Admitting: Neurology

## 2020-03-29 NOTE — Telephone Encounter (Signed)
Called the patient to review the MRI results. Pt verbalized understanding. Pt inquired with what Dr Dohmeier's thoughts and recommendations are in regards to her with work. She has returned to work and was attempting to work full time.  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 In the reading material provided to the patient it described decreasing visual stimulation. She wanted to get MD's thoughts on how she should complete her work day because the entire 8 hrs day is bothersome. Work has been understanding but she wants to make sure she isn't over doing it. She has been working a couple hrs at a time, taking a break for couple hrs and then couple hours on again. Advised that I would get her thoughts on what she would recommend to make sure not overstimulating and delaying healing.

## 2020-03-29 NOTE — Telephone Encounter (Signed)
-----   Message from Melvyn Novas, MD sent at 03/29/2020 12:51 PM EDT ----- IMPRESSION:   Unremarkable brain parenchyma. Stable right orbital fracture with minor herniation of orbital fat into the right maxillary sinus. No acute findings.   INTERPRETING PHYSICIAN:  Suanne Marker, MD  Cc to Dr Arlan Organ M.,MD

## 2020-03-29 NOTE — Telephone Encounter (Signed)
Called the patient back to advise that after discussing with Dr Dohmeier she states that every patient is different in what they can handle. She states that she can gradually increase her time on the computer based off of her symptoms and how she feels. If the couple hours on and couple off is working for you that is fine and just as it gradually gets better you can begin to increase time. She recommends also blue blocking glasses.   There was no answer when I called, and VM was full. If patient returns call please advise information above. Ill send in mychart message as well.

## 2020-03-29 NOTE — Progress Notes (Signed)
IMPRESSION:   Unremarkable brain parenchyma. Stable right orbital fracture with minor herniation of orbital fat into the right maxillary sinus. No acute findings.   INTERPRETING PHYSICIAN:  Suanne Marker, MD  Cc to Dr Arlan Organ M.,MD

## 2020-04-02 ENCOUNTER — Telehealth: Payer: Self-pay | Admitting: Neurology

## 2020-04-02 NOTE — Telephone Encounter (Signed)
Called the patient back and discussed her concerns. She stated Saturday she had a really good day, was starting to feel like old self and then all of a sudden on Sunday the pain in the Right forehead scalp area came back really sharp pain, and throbbing. She used ice pack and the throbbing subsided. She has also been using the arnicare tablets which helps some. She has noted that she has some congestion and nausea this morning. She states that she has not vomited but nausea is present. She states that these symptoms had for the most part subsided and she thought she was getting better. She was concerned with how bad her head hurt.  Advised that these injuries the head pain can be intermittent and can come and go. I advised that she can try tylenol extra strength to see is that helps with the head pain. She can also try peppermint essential oil. She has nausea medication which she can take to help with her nausea. Advised the patient that she should call us if the head pain comes back and will not go away with the remedies that she has in place or if she begins to vomit. Pt verbalized understanding. Advised the patient that since she complained of congestion and we are in midst of the seasons changing this could also be sinus' acting up and she should continue to watch her symptoms. Pt verbalized understanding. She was appreciative for the call back. I did advise the patient I will make sure Dr Vickey Huger is aware of are call in case she had other concerns related to what she described or other recommendations. Advised if so I would call her back.

## 2020-04-02 NOTE — Telephone Encounter (Signed)
Pt called, started feeling bad on Sunday' head swollen, congested, nausea, feeling really tired,  and pain in my head has come back. Would like a call from the nurse.

## 2020-04-02 NOTE — Telephone Encounter (Signed)
Dear Alexis Woods, he will have hit all the main and reasonable causes and classic treatments.  I will wait if Alexis Woods complaints improve.  Nothing to add at this time.

## 2020-04-04 ENCOUNTER — Other Ambulatory Visit: Payer: Self-pay

## 2020-04-04 ENCOUNTER — Ambulatory Visit: Payer: 59 | Admitting: Neurology

## 2020-04-04 DIAGNOSIS — R561 Post traumatic seizures: Secondary | ICD-10-CM

## 2020-04-04 DIAGNOSIS — S069X2A Unspecified intracranial injury with loss of consciousness of 31 minutes to 59 minutes, initial encounter: Secondary | ICD-10-CM

## 2020-04-06 NOTE — Progress Notes (Signed)
This is an abnormal EEG showing a focal slowing over the right  temple which may correlate with a previously reported head injury  and facial-and scalp swelling over that region.  The second abnormality are 2 bursts of generalized polyspike  wave discharges at 3 to 4 Hz, none lasting longer than 4 seconds.  The ECG remained in normal rhythm.   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  I will ask Mrs Haft to follow up with [facility] in the next 2 weeks.

## 2020-04-06 NOTE — Procedures (Signed)
This EEG was performed on 04 April 2020 with a running time of 27 minutes and 7 seconds and included hyperventilation and photo stimulation maneuvers.  The patient had no prior history of seizure activity prior to her fall which let to right facial and temporal injuries, a fractured nose and significant bruising. She presented to the ED on 03-03-2020 after her husband had witnessed a seizure  and had  started on Keppra on Saturday, March 03, 2020.   This EEG was acquired with International system of electrode placement also known as the 10-20 system electric activity was acquired with a high and low frequency filter (70 and 1 hertz) as the data were recorded continuously and digitally stored.  An ECG single channel is associated with this recording:  The recording begins with an alert and responsive patient, eyes open but shows bifrontal slowing as well as right temporal slowing at baseline.  Once eye closure is performed a posterior dominant rhythm can be established at 9 to 10 Hz bilaterally symmetric but without a significant amplitude.  There is still asymmetry as the right temporal channel document ongoing slowing.  Hyperventilation is then performed forming an increased amplitude and is followed by a resting.  Of about 1.5 minutes in which there are again right temporal slow discharges noted.  Photic stimulation is done immediately and trained between 5 and 18 Hz and there is notable phase reversal seen at the T6 electrode temporal this continued for several minutes until a sudden burst of 3 Hz generalized polyspike wave activity arises.  There is no preceding focus.  This 25 wave discharges last only about 3 to 4 seconds and where according to the technicians notes not associated with changes in the patient's behavior or movement.  Two generalized discharges have been recorded and I have also printed these on paper.  This is an abnormal EEG showing a focal slowing over the right temple which may  correlate with a previously reported head injury and facial-scalp swelling.   The second abnormality are 2 bursts of generalized polyspike wave discharges at 3 to 4 Hz, none lasting longer than 4 seconds.  The ECG remaind in normal rhythm.   Impression: A medication that works for primary generalized seizure types should be considered. Usually, these are genetically determined, and will manifest first in childhood.  I will ask Mrs Guevara to follow up with [facility] in the next 2 weeks.   Melvyn Novas, MD

## 2020-04-11 ENCOUNTER — Telehealth: Payer: Self-pay | Admitting: Neurology

## 2020-04-11 NOTE — Telephone Encounter (Signed)
-----   Message from Melvyn Novas, MD sent at 04/06/2020  3:04 PM EDT ----- This is an abnormal EEG showing a focal slowing over the right  temple which may correlate with a previously reported head injury  and facial-and scalp swelling over that region.  The second abnormality are 2 bursts of generalized polyspike  wave discharges at 3 to 4 Hz, none lasting longer than 4 seconds.  The ECG remained in normal rhythm.   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  I will ask Mrs Moritz to follow up with [facility] in the next 2 weeks.

## 2020-04-11 NOTE — Telephone Encounter (Signed)
Called the patient and reviewed the EEG results. Informed the patient that there was abnormal activity on the EEG. Advised the patient that Dr Vickey Huger wanted to have her come in and discuss and make sure treatment plan is good. Advised her to continue her medication. Scheduled for Monday at 11:30 am with Dr Vickey Huger. Pt was appreciative for the call.

## 2020-04-15 ENCOUNTER — Other Ambulatory Visit: Payer: Self-pay | Admitting: Neurology

## 2020-04-16 ENCOUNTER — Encounter: Payer: Self-pay | Admitting: Neurology

## 2020-04-16 ENCOUNTER — Ambulatory Visit: Payer: 59 | Admitting: Neurology

## 2020-04-16 ENCOUNTER — Telehealth: Payer: Self-pay | Admitting: Neurology

## 2020-04-16 VITALS — BP 107/72 | HR 66 | Ht 65.5 in | Wt 144.0 lb

## 2020-04-16 DIAGNOSIS — G40B09 Juvenile myoclonic epilepsy, not intractable, without status epilepticus: Secondary | ICD-10-CM | POA: Insufficient documentation

## 2020-04-16 DIAGNOSIS — R9401 Abnormal electroencephalogram [EEG]: Secondary | ICD-10-CM | POA: Insufficient documentation

## 2020-04-16 DIAGNOSIS — S0231XA Fracture of orbital floor, right side, initial encounter for closed fracture: Secondary | ICD-10-CM | POA: Insufficient documentation

## 2020-04-16 MED ORDER — LAMOTRIGINE 25 MG PO TABS: ORAL_TABLET | ORAL | 3 refills | Status: DC

## 2020-04-16 NOTE — Progress Notes (Signed)
Provider:  Melvyn Novas, MD  Primary Care Physician:  Everlean Cherry, MD [address] [place] [address]     Referring Provider: Everlean Cherry, Md [address] [place],  [place] [address]          Chief Complaint according to patient   Patient presents with:    . New Patient (Initial Visit)     pt with, rm 10. sun 8/29 she was walking [place] [employer] and someone walked out the same time and witness she fell and hit her head, + LOC and was admitted to hospital til 9/2 and dc home. Sat 9/4 11:30 pm sz in bed and had another one in ER at check in. she was dc on keppra at that time. prior to this never had a hx of sz      Other only thing she can think of that was new leading up is she initially started a Ryze mushroom coffee, which has no caffeine         HISTORY OF PRESENT ILLNESS:  Alexis Woods is a 24. year old  Caucasian female patient seen here in a REVISIT on 04/16/2020 from Dr. Arlan Organ, MD, at [facility]. 04-16-2020: We obtained an MRI of the brain:  The MRI documented an orbital floor fracture but have not completely healed as orbital fat was protruding into the right maxillary sinus.  I think this is a good explanation for her current symptoms of facial right abnormal sensation the feeling of numbness the feeling of fullness and sinus pressure and drainage.  The patient also underwent an EEG study.  This was performed on 6 October and it was abnormal with focal slowing over the right temple which may correlate well with the previously reported head injury facial and scalp swelling over that region.  The second abnormality were 2 bursts of generalized polyspike wave discharges at about 3.5 Hz none lasting longer than 4 seconds, apparently none lasting long enough to progress into a visible seizure activity the EKG remained in normal rhythm throughout these events so this was an electrographic seizure but not a clinical seizure.  Usually  we see this as a GMA I reviewed juvenile myoclonic epilepsy onset I am also very concerned that there was a generalized onset and I would have expected a focal onset if it would be related to the accident.  She reports today that she has ZONE-OUT spells , she seems to be somewhere else, absent and the spells most likely to occur when fatigued.  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    .  Chief concern according to patient :   Alexis Woods went to a store in her usual good health on Sunday, 29 August and she has no memory but occurred next but she was witnessed to have fallen and hit her head.  A smart phone picture that her husband took shows that her right face is very bruised and a braised and she also reports that she had not all over her scalp the right face and right scalp were very tender.  She was admitted to the hospital and stayed there until 2 September then discharged home. She had broken her nose.  She remained with  retrograde amnesia patchy memory and difficulties retaining new memory.  Her on Friday following  she had projectile vomiting at home only and and by Saturday, 4 September at about 11:30 PM she suffered a seizure in bed which was witnessed by her husband. Returning to the Emergency room.   She was returning to the emergency room where she had another witnessed seizure- this time she was started on Keppra.  Prior to her head injury she never had suffered a seizure.  There is no family history of epilepsy.  She has no previous  history of any traumatic brain injuries. She recently has had a UTI, and she has been drinking mushroom coffee. She was sleep derived for planning a trip.     I have the pleasure of seeing Alexis Woods today, a right -handed White or Caucasian female with a possible sleep disorder.  She has suffered a TBI-   has a past medical history of Seizure (HCC).     Social history:   Patient is working as a radio show host ,  The patient currently works par time.  Tobacco use: none.  ETOH use; 1/month, Caffeine intake in form of Coffee( now quit ) Regular exercise in form of running, burpees..   Hobbies : radio, robotics.        Review of Systems: Out of a complete 14 system review, the patient complains of only the following symptoms, and all other reviewed systems are negative.:   retrograde amnesia,  headaches posttraumatic, skull and dental pain- facial pain, nasal fracture. Orbital bottom fracture.    Social History   Socioeconomic History  . Marital status: Married    Spouse name: Not on file  . Number of children: Not on file  . Years of education: Not on file  . Highest education level: Not on file  Occupational History  . Not on file  Tobacco Use  . Smoking status: Never Smoker  . Smokeless tobacco: Never Used  Substance and Sexual Activity  . Alcohol use: Not Currently  . Drug use: Not Currently  . Sexual activity: Not on file  Other Topics Concern  . Not on file  Social History Narrative  . Not on file   Social Determinants of Health   Financial Resource Strain:   . Difficulty of Paying Living Expenses: Not on file  Food Insecurity:   . Worried About Programme researcher, broadcasting/film/videounning Out of Food in the Last Year: Not on file  . Ran Out of Food in the Last Year: Not on file  Transportation Needs:   . Lack of Transportation (Medical): Not on file  . Lack of Transportation (Non-Medical): Not on file  Physical Activity:   . Days of Exercise per Week: Not on file  . Minutes of Exercise per Session: Not on file  Stress:   . Feeling of Stress : Not on file  Social Connections:   . Frequency of Communication with Friends and Family: Not on file  . Frequency of Social Gatherings with Friends and Family: Not on file  . Attends Religious Services: Not on file  . Active Member of Clubs or Organizations: Not on file  . Attends BankerClub or Organization Meetings: Not on  file  . Marital Status: Not on file    No family history on file.  Past Medical History:  Diagnosis Date  . Seizure [facility] [address](HCC)     Past Surgical History:  Procedure Laterality Date  . ABDOMINAL HYSTERECTOMY    . CLEFT LIP REPAIR  1987     Current Outpatient Medications on File Prior to Visit  Medication Sig Dispense Refill  . donepezil (ARICEPT) 5 MG tablet Take 1 tablet (5 mg total) by mouth at bedtime. 30 tablet 0  . gabapentin (NEURONTIN) 100 MG capsule Take 1 capsule (100 mg total) by mouth 2 (two) times daily. 60 capsule 5  . Homeopathic Products (ARNICARE) GEL Apply topically in the morning, at noon, in the evening, and at bedtime.    . levETIRAcetam (KEPPRA) 500 MG tablet Take 1 tablet (500 mg total) by mouth 2 (two) times daily. 60 tablet 5  . MAGNESIUM GLYCINATE PO Take 500 mg by mouth daily.    . OIL OF OREGANO PO Take by mouth.    . Omega-3 1000 MG CAPS Take 1 capsule by mouth daily.     Marland Kitchen OVER THE COUNTER MEDICATION 1 tablet daily. Curamed.    . peppermint oil liquid by Does not apply route as needed (headache).    Marland Kitchen VITAMIN D, ERGOCALCIFEROL, PO Take 1 tablet by mouth daily.    Marland Kitchen VITAMIN E PO Take 1 tablet by mouth daily.    . cyclobenzaprine (FLEXERIL) 5 MG tablet Take 5 mg by mouth every 8 (eight) hours as needed. (Patient not taking: Reported on 04/16/2020)     No current facility-administered medications on file prior to visit.    Allergies  Allergen Reactions  . Sertraline Nausea And Vomiting    Physical exam:  Today's Vitals   04/16/20 1153  BP: 107/72  Pulse: 66  Weight: 144 lb (65.3 kg)  Height: 5' 5.5" (1.664 m)   Body mass index is 23.6 kg/m.   Wt Readings from Last 3 Encounters:  04/16/20 144 lb (65.3 kg)  03/19/20 151 lb (68.5 kg)  10/16/17 202 lb (91.6 kg)     Ht Readings from Last 3 Encounters:  04/16/20 5' 5.5" (1.664 m)  03/19/20 5\' 6"  (1.676 m)  03/30/17 5\' 6"  (1.676 m)      General: The patient is awake, alert and appears not  in acute distress. The patient is well groomed. Head: Normocephalic, but traumatic. Clef lip -  Neck is supple. Mallampati ,  Cardiovascular:  Regular rate and cardiac rhythm by pulse,  without distended neck veins. Respiratory: Lungs are clear to auscultation.  Skin:  Without evidence of ankle edema, or rash. Trunk: The patient's posture is erect.   Neurologic exam : The patient is awake and alert, oriented to place and time.   Memory subjective described as intact.  Attention span & concentration ability appears normal.  Speech is fluent,  without  dysarthria, dysphonia or aphasia.  Mood and affect are appropriate.   Cranial nerves: no loss of smell or taste reported  Pupils are equal and briskly reactive to light. Funduscopic exam revealed no oedema or palor or bleed. 05/30/17  Extraocular movements in vertical and horizontal planes were intact and without nystagmus. No Diplopia. Visual fields by finger perimetry are intact. Hearing was intact to soft voice and finger rubbing.    Facial sensation reduced to fine touch though out her right face- right scalp, right orbit. .  Facial motor strength is symmetric and tongue and uvula move midline.  Neck ROM : rotation, tilt and flexion extension were normal for age and shoulder shrug was symmetrical.    Motor exam:  Symmetric bulk, tone and ROM.   Normal tone without cog wheeling, symmetric grip strength .   Sensory:  Fine touch, pinprick and vibration were tested  and  normal.  Proprioception tested in the upper extremities was  normal.   Coordination: Rapid alternating movements in the fingers/hands were of normal speed.  The Finger-to-nose maneuver was intact without evidence of ataxia, dysmetria or tremor.   Gait and station: Patient could rise unassisted from a seated position, walked without assistive device.  Stance is of normal width/ base and the patient turned with 3 steps.  Toe and heel walk were deferred.  Deep tendon reflexes:  in the  upper and lower extremities are symmetric and intact. Patella was very brisk.  Babinski response was deferred .        After spending a total time of  45  minutes face to face -mostly time for physical and neurologic examination, and additional 15- 20 minutes-  review of laboratory studies,  personal review of imaging studies, reports and results of other testing and review of referral information / records as far as provided in visit, I have established the following assessments:  1) TBI with vomiting , LOC and post concussion , subacute phase. 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   2) The patient has the classic symptoms of amnestic memory lapses for at least minutes before she ever entered the store in which she finally fell, seized, and hit her head,  She has had some short-term memory difficulty since but they are improving.  She has had 2 seizures post traumatic.  3) She continues to have posttraumatic headaches and MRI indicated a right orbital floor fracture with orbital fat protruding  scalp tenderness and looking at the documented bony injuries -I am very happy to see that her ocular movements are intact there is no nystagmus, and no evidence of retro-orbital bleed. Just asking a MD eye surgeon or ENT surgeon to follow up with her.     My Plan is to proceed with:  Keppra to be continued - 500 mg bid.   I will add Lamictal for a generalized seizure type-  Starting at 25 mg bid po and titrating upwards.  We may d/c keppra after she reaches 100 mg bid.   Gabapentin has been used for twitching and pain in hands and feet- will not take her off , given how well it had worked for this indication.    The patient can return to work with 6 month driving restriction- and restrictions to operate machinery.  I had started her on aricept which has anecdotal evidence of improving memory in TBI patient.  She feels iy  helps and will give it for another 3 month.  Facial and dental pain to be treated with low dose gabapentin- she will continue.   I would like to thank  Everlean Cherry, Md [address] [place],  [place] [address] for allowing me to meet with and to take care of this pleasant patient.   In short, Alexis Woods is presenting with a concussion and abnormal MRI and EEG-   I plan to follow up either personally or through our NP within 2-3 month.   CC: I will share my notes with PCP   Electronically signed by: Melvyn Novas, MD 04/16/2020 12:21 PM  [facility] and [facility] Board certified by The [employer] of Sleep Medicine and Diplomate of the [employer] of [employer]. Board certified In Neurology through the ABPN, Fellow of the [employer] of [employer]. Medical Director of [facility].

## 2020-04-16 NOTE — Telephone Encounter (Signed)
Pt called wanting to ask the RN where she can go about getting her Medical Alert Bracelet and getting her dog trained as a service dog. Please advise.

## 2020-04-16 NOTE — Patient Instructions (Addendum)
Seizure, Adult A seizure is a sudden burst of abnormal electrical activity in the brain. Seizures usually last from 30 seconds to 2 minutes. They can cause many different symptoms. Usually, seizures are not harmful unless they last a long time. What are the causes? Common causes of this condition include:  Fever or infection.  Conditions that affect the brain, such as: ? A brain abnormality that you were born with. ? A brain or head injury. ? Bleeding in the brain. ? A tumor. ? Stroke. ? Brain disorders such as autism or cerebral palsy.  Low blood sugar.  Conditions that are passed from parent to child (are inherited).  Problems with substances, such as: ? Having a reaction to a drug or a medicine. ? Suddenly stopping the use of a substance (withdrawal). In some cases, the cause may not be known. A person who has repeated seizures over time without a clear cause has a condition called epilepsy. What increases the risk? You are more likely to get this condition if you have:  A family history of epilepsy.  Had a seizure in the past.  A brain disorder.  A history of head injury, lack of oxygen at birth, or strokes. What are the signs or symptoms? There are many types of seizures. The symptoms vary depending on the type of seizure you have. Examples of symptoms during a seizure include:  Shaking (convulsions).  Stiffness in the body.  Passing out (losing consciousness).  Head nodding.  Staring.  Not responding to sound or touch.  Loss of bladder control and bowel control. Some people have symptoms right before and right after a seizure happens. Symptoms before a seizure may include:  Fear.  Worry (anxiety).  Feeling like you may vomit (nauseous).  Feeling like the room is spinning (vertigo).  Feeling like you saw or heard something before (dj vu).  Odd tastes or smells.  Changes in how you see. You may see flashing lights or spots. Symptoms after a  seizure happens can include:  Confusion.  Sleepiness.  Headache.  Weakness on one side of the body. How is this treated? Most seizures will stop on their own in under 5 minutes. In these cases, no treatment is needed. Seizures that last longer than 5 minutes will usually need treatment. Treatment can include:  Medicines given through an IV tube.  Avoiding things that are known to cause your seizures. These can include medicines that you take for another condition.  Medicines to treat epilepsy.  Surgery to stop the seizures. This may be needed if medicines do not help. Follow these instructions at home: Medicines  Take over-the-counter and prescription medicines only as told by your doctor.  Do not eat or drink anything that may keep your medicine from working, such as alcohol. Activity  Do not do any activities that would be dangerous if you had another seizure, like driving or swimming. Wait until your doctor says it is safe for you to do them.  If you live in the [place] ask your local DMV (department of motor vehicles) when you can drive.  Get plenty of rest. Teaching others Teach friends and family what to do when you have a seizure. They should:  Lay you on the ground.  Protect your head and body.  Loosen any tight clothing around your neck.  Turn you on your side.  Not hold you down.  Not put anything into your mouth.  Know whether or not you need emergency care.  Stay with you until you are better.  General instructions  Contact your doctor each time you have a seizure.  Avoid anything that gives you seizures.  Keep a seizure diary. Write down: ? What you think caused each seizure. ? What you remember about each seizure.  Keep all follow-up visits as told by your doctor. This is important. Contact a doctor if:  You have another seizure.  You have seizures more often.  There is any change in what happens during your seizures.  You keep having  seizures with treatment.  You have symptoms of being sick or having an infection. Get help right away if:  You have a seizure that: ? Lasts longer than 5 minutes. ? Is different than seizures you had before. ? Makes it harder to breathe. ? Happens after you hurt your head.  You have any of these symptoms after a seizure: ? Not being able to speak. ? Not being able to use a part of your body. ? Confusion. ? A bad headache.  You have two or more seizures in a row.  You do not wake up right after a seizure.  You get hurt during a seizure. These symptoms may be an emergency. Do not wait to see if the symptoms will go away. Get medical help right away. Call your local emergency services (911 in the [place] Do not drive yourself to the hospital. Summary  Seizures usually last from 30 seconds to 2 minutes. Usually, they are not harmful unless they last a long time.  Do not eat or drink anything that may keep your medicine from working, such as alcohol.  Teach friends and family what to do when you have a seizure.  Contact your doctor each time you have a seizure. This information is not intended to replace advice given to you by your health care provider. Make sure you discuss any questions you have with your health care provider. Document Revised: 09/03/2018 Document Reviewed: 09/03/2018 Elsevier Patient Education  2020 [employer]    Lamotrigine tablets   We start with 25 mg bid po. After 14 days increase to 50 mg bid po and after another 14 days to 75 mg bid po.  I will revisit with you in 6-8 weeks to finalize titration to 100 mg bid and remove the Keppra/ Leviracetam. You will stay on Neurontin / gabapentin and aricept for now.     What is this medicine? LAMOTRIGINE (la MOE Patrecia Pace) is used to control seizures in adults and children with epilepsy and Lennox-Gastaut syndrome. It is also used in adults to treat bipolar disorder. This medicine may be used for other  purposes; ask your health care provider or pharmacist if you have questions. COMMON BRAND NAME(S): Lamictal, Subvenite What should I tell my health care provider before I take this medicine? They need to know if you have any of these conditions:  aseptic meningitis during prior use of lamotrigine  depression  folate deficiency  kidney disease  liver disease  suicidal thoughts, plans, or attempt; a previous suicide attempt by you or a family member  an unusual or allergic reaction to lamotrigine or other seizure medications, other medicines, foods, dyes, or preservatives  pregnant or trying to get pregnant  breast-feeding How should I use this medicine? Take this medicine by mouth with a glass of water. Follow the directions on the prescription label. Do not chew these tablets. If this medicine upsets your stomach, take it with food or milk. Take  your doses at regular intervals. Do not take your medicine more often than directed. A special MedGuide will be given to you by the pharmacist with each new prescription and refill. Be sure to read this information carefully each time. Talk to your pediatrician regarding the use of this medicine in children. While this drug may be prescribed for children as young as 2 years for selected conditions, precautions do apply. Overdosage: If you think you have taken too much of this medicine contact a poison control center or emergency room at once. NOTE: This medicine is only for you. Do not share this medicine with others. What if I miss a dose? If you miss a dose, take it as soon as you can. If it is almost time for your next dose, take only that dose. Do not take double or extra doses. What may interact with this medicine?  atazanavir  carbamazepine  female hormones, including contraceptive or birth control  pills  lopinavir  methotrexate  phenobarbital  phenytoin  primidone  pyrimethamine  rifampin  ritonavir  trimethoprim  valproic acid This list may not describe all possible interactions. Give your health care provider a list of all the medicines, herbs, non-prescription drugs, or dietary supplements you use. Also tell them if you smoke, drink alcohol, or use illegal drugs. Some items may interact with your medicine. What should I watch for while using this medicine? Visit your doctor or health care provider for regular checks on your progress. If you take this medicine for seizures, wear a Medic Alert bracelet or necklace. Carry an identification card with information about your condition, medicines, and doctor or health care provider. It is important to take this medicine exactly as directed. When first starting treatment, your dose will need to be adjusted slowly. It may take weeks or months before your dose is stable. You should contact your doctor or health care provider if your seizures get worse or if you have any new types of seizures. Do not stop taking this medicine unless instructed by your doctor or health care provider. Stopping your medicine suddenly can increase your seizures or their severity. This medicine may cause serious skin reactions. They can happen weeks to months after starting the medicine. Contact your health care provider right away if you notice fevers or flu-like symptoms with a rash. The rash may be red or purple and then turn into blisters or peeling of the skin. Or, you might notice a red rash with swelling of the face, lips or lymph nodes in your neck or under your arms. You may get drowsy, dizzy, or have blurred vision. Do not drive, use machinery, or do anything that needs mental alertness until you know how this medicine affects you. To reduce dizzy or fainting spells, do not sit or stand up quickly, especially if you are an older patient. Alcohol can  increase drowsiness and dizziness. Avoid alcoholic drinks. If you are taking this medicine for bipolar disorder, it is important to report any changes in your mood to your doctor or health care provider. If your condition gets worse, you get mentally depressed, feel very hyperactive or manic, have difficulty sleeping, or have thoughts of hurting yourself or committing suicide, you need to get help from your health care provider right away. If you are a caregiver for someone taking this medicine for bipolar disorder, you should also report these behavioral changes right away. The use of this medicine may increase the chance of suicidal thoughts or actions. Pay  special attention to how you are responding while on this medicine. Your mouth may get dry. Chewing sugarless gum or sucking hard candy, and drinking plenty of water may help. Contact your doctor if the problem does not go away or is severe. Women who become pregnant while using this medicine may enroll in the [place] Antiepileptic Drug Pregnancy Registry by calling [phone]. This registry collects information about the safety of antiepileptic drug use during pregnancy. This medicine may cause a decrease in folic acid. You should make sure that you get enough folic acid while you are taking this medicine. Discuss the foods you eat and the vitamins you take with your health care provider. What side effects may I notice from receiving this medicine? Side effects that you should report to your doctor or health care professional as soon as possible:  allergic reactions like skin rash, itching or hives, swelling of the face, lips, or tongue  changes in vision  depressed mood  elevated mood, decreased need for sleep, racing thoughts, impulsive behavior  loss of balance or coordination  mouth sores  rash, fever, and swollen lymph nodes  redness, blistering, peeling or loosening of the skin, including inside the mouth  right upper  belly pain  seizures  severe muscle pain  signs and symptoms of aseptic meningitis such as stiff neck and sensitivity to light, headache, drowsiness, fever, nausea, vomiting, rash  signs of infection - fever or chills, cough, sore throat, pain or difficulty passing urine  suicidal thoughts or other mood changes  swollen lymph nodes  trouble walking  unusual bruising or bleeding  unusually weak or tired  yellowing of the eyes or skin Side effects that usually do not require medical attention (report to your doctor or health care professional if they continue or are bothersome):  diarrhea  dizziness  dry mouth  stuffy nose  tiredness  tremors  trouble sleeping This list may not describe all possible side effects. Call your doctor for medical advice about side effects. You may report side effects to FDA at 1-800-FDA-1088. Where should I keep my medicine? Keep out of reach of children. Store at room temperature between 15 and 30 degrees C (59 and 86 degrees F). Throw away any unused medicine after the expiration date. NOTE: This sheet is a summary. It may not cover all possible information. If you have questions about this medicine, talk to your doctor, pharmacist, or health care provider.  2020 Elsevier/Gold Standard (2018-09-17 15:03:40)

## 2020-04-17 NOTE — Telephone Encounter (Signed)
Called the patient back. I discussed with Dr Vickey Huger. No one in our office has ordered a medical service dog. Advised I was unsure the guidance on where she would go for that and how to get approved for that process. Advised that the medical alert bracelet was able to be purchased on [place] or local pharmacy/drugstores.

## 2020-04-23 ENCOUNTER — Encounter: Payer: Self-pay | Admitting: Neurology

## 2020-05-23 ENCOUNTER — Ambulatory Visit: Payer: 59 | Admitting: Neurology

## 2020-05-28 ENCOUNTER — Telehealth: Payer: Self-pay | Admitting: Neurology

## 2020-05-28 NOTE — Telephone Encounter (Signed)
I called pt. She felt depressed and had suicidal thoughts while taking lamictal 75mg  BID. She backed down to lamictal 50mg  BID and feels better. She wants to know if this dosage is ok.

## 2020-05-28 NOTE — Telephone Encounter (Signed)
I spoke with Dr. Vickey Huger. Pt can continue lamictal 50mg  BID but should let know if she has any seizure like activity.  I called pt, left a message on her work [place], per Engineer, technical sales, advising her of this information and asking her to call [employer] back for any further questions or concerns.

## 2020-05-28 NOTE — Telephone Encounter (Signed)
Pt. states she would like to stay at 50 mg of lamoTRIgine (LAMICTAL) 25 MG tablet. She states she is having depression & crying with 75 mg. She would like a call from RN. Please advise.

## 2020-05-30 ENCOUNTER — Ambulatory Visit: Payer: Self-pay | Admitting: Neurology

## 2020-06-06 ENCOUNTER — Telehealth: Payer: Self-pay | Admitting: Neurology

## 2020-06-06 ENCOUNTER — Encounter: Payer: Self-pay | Admitting: Neurology

## 2020-06-06 NOTE — Telephone Encounter (Signed)
Pt called wanting to update the provider about her appt with the Neurosurgeon. Please call back when available.

## 2020-06-06 NOTE — Telephone Encounter (Signed)
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 This will require surgery. She has been having increase in pain and the neurosurgeon advised the pt to increase to 200 mg BID.  Pt was asking if we were ok with that and would refill for that.i informed the patient that usually if another provider is recommending a dose change with medication they are typically the ones who prescribe it. Pt still has some but because she is taking more than prescribed she will run out sooner. I advised at that time I would advise she contact the neurosurgeon for the refill since there is a increase ordered by him.  She also wanted to advise that she is tolerating the Lamictal at 75 mg BID. I have corrected the dosages on both medication on file to reflect she is taking that.

## 2020-06-06 NOTE — Telephone Encounter (Signed)
Called the patient back to discuss  

## 2020-06-11 ENCOUNTER — Other Ambulatory Visit: Payer: Self-pay | Admitting: Neurological Surgery

## 2020-06-13 ENCOUNTER — Encounter: Payer: Self-pay | Admitting: Neurology

## 2020-06-13 ENCOUNTER — Ambulatory Visit: Payer: 59 | Admitting: Neurology

## 2020-06-13 VITALS — BP 110/71 | HR 57 | Ht 65.5 in | Wt 144.0 lb

## 2020-06-13 DIAGNOSIS — S069X2A Unspecified intracranial injury with loss of consciousness of 31 minutes to 59 minutes, initial encounter: Secondary | ICD-10-CM

## 2020-06-13 DIAGNOSIS — Z5181 Encounter for therapeutic drug level monitoring: Secondary | ICD-10-CM

## 2020-06-13 DIAGNOSIS — R561 Post traumatic seizures: Secondary | ICD-10-CM | POA: Diagnosis not present

## 2020-06-13 MED ORDER — LAMOTRIGINE 100 MG PO TABS: 100.0000 mg | ORAL_TABLET | Freq: Two times a day (BID) | ORAL | 3 refills | Status: DC

## 2020-06-13 MED ORDER — LEVETIRACETAM 500 MG PO TABS: 500.0000 mg | ORAL_TABLET | Freq: Two times a day (BID) | ORAL | 3 refills | Status: DC

## 2020-06-13 NOTE — Progress Notes (Signed)
Provider:  Melvyn Novas, MD  Primary Care Physician:  Alexis Cherry, MD [address] [place] [address]     Referring Provider: Everlean Cherry, Md [address] [place],  [place] [address]          Chief Complaint according to patient   Patient presents with:    . New Patient (Initial Visit)     pt with, rm 10. sun 8/29 she was at [employer] where she fell and hit her head, + LOC and was admitted to hospital till 9/2 and dc home. Sat 9/4 11:30 pm sz in bed and had another one in ER at check in. she was dc on keppra at that time. prior to this never had a hx of sz      Other      06-13-2020-now returning after a cyst on her spine was discovered and she will undergo surgical resection with Dr. Maurice Woods on Tuesday , 06-19-2020.        HISTORY OF PRESENT ILLNESS:  Alexis Woods is a 62. 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 Dr. Maurice Woods plans a surgery soon. The patient had originally seen me in August or September of this year after she had a fall was in a [employer], that ended up in seizure activity.  She had a recurrent seizure a month later and was diagnosed with a traumatic brain injury and posttraumatic seizures.  The medications we have initiated are all by mouth, oral and she will have to take her seizure medicines at midnight is a couple of sips of water before her surgery this way she does not violate the n.p.o. rule, and as soon as she is able to swallow safely after surgery I would also like her to take the next dose by mouth.  There is no need to switch her to an IV preparation and her surgery may be done with a spinal block which would allow her to take p.o. medicines much sooner than under general anesthesia.        Originally refereed in consultation from Dr. Arlan Organ, MD, at [facility]. 04-16-2020: We obtained an MRI of  the brain: The MRI documented an orbital floor fracture but have not completely healed as orbital fat was protruding into the right maxillary sinus.  I think this is a good explanation for her current symptoms of facial right abnormal sensation the feeling of numbness the feeling of fullness and sinus pressure and drainage. A brain base fracture was also discovered, later as radiology revisit her images.   The patient also underwent an EEG study.  This was performed on 6 October and it was abnormal with focal slowing over the right temple which may correlate well with the previously reported head injury facial and scalp swelling over that region.  The second abnormality were 2 bursts of generalized polyspike wave discharges at about 3.5 Hz none lasting longer than 4 seconds, apparently none lasting long enough to progress into a visible seizure activity the EKG remained in normal rhythm throughout these events so this was an electrographic seizure but not a clinical seizure.  Usually we see this as a GMA I reviewed juvenile myoclonic epilepsy onset I am also very concerned that there was a generalized onset and I would have expected a focal  onset if it would be related to the accident.  She reports today that she has ZONE-OUT spells , she seems to be somewhere else, absent and the spells most likely to occur when fatigued.  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    .  Chief concern according to patient :   Alexis Woods went to a store in her usual good health on Sunday, 29 August and she has no memory but occurred next but she was witnessed to have fallen and hit her head.  A smart phone picture that her husband took shows that her right face is very bruised and a braised and she also reports that she had not all over her scalp the right face and right scalp were very tender.  She was admitted to the hospital and stayed  there until 2 September then discharged home. She had broken her nose.  She remained with  retrograde amnesia patchy memory and difficulties retaining new memory.  Her on Friday following she had projectile vomiting at home only and and by Saturday, 4 September at about 11:30 PM she suffered a seizure in bed which was witnessed by her husband. Returning to the Emergency room.   She was returning to the emergency room where she had another witnessed seizure- this time she was started on Keppra.  Prior to her head injury she never had suffered a seizure.  There is no family history of epilepsy.  She has no previous  history of any traumatic brain injuries. She recently has had a UTI, and she has been drinking mushroom coffee. She was sleep derived for planning a trip.     I have the pleasure of seeing Alexis Woods today, a right -handed White or Caucasian female with a possible sleep disorder.  She has suffered a TBI-   has a past medical history of Seizure (HCC).     Social history:  Patient is working as a radio show host ,  The patient currently works par time.  Tobacco use: none.  ETOH use; 1/month, Caffeine intake in form of Coffee( now quit ) Regular exercise in form of running, burpees..   Hobbies : radio, robotics.        Review of Systems: Out of a complete 14 system review, the patient complains of only the following symptoms, and all other reviewed systems are negative.:   retrograde amnesia,  headaches posttraumatic, skull and dental pain- facial pain, nasal fracture. Orbital bottom fracture.    Social History   Socioeconomic History  . Marital status: Married    Spouse name: Not on file  . Number of children: Not on file  . Years of education: Not on file  . Highest education level: Not on file  Occupational History  . Not on file  Tobacco Use  . Smoking status: Never Smoker  . Smokeless tobacco: Never Used  Substance and Sexual Activity  . Alcohol use: Not Currently  .  Drug use: Not Currently  . Sexual activity: Not on file  Other Topics Concern  . Not on file  Social History Narrative  . Not on file   Social Determinants of Health   Financial Resource Strain: Not on file  Food Insecurity: Not on file  Transportation Needs: Not on file  Physical Activity: Not on file  Stress: Not on file  Social Connections: Not on file  No family history on file.  Past Medical History:  Diagnosis Date  . Seizure [facility])     Past Surgical History:  Procedure Laterality Date  . ABDOMINAL HYSTERECTOMY    . CLEFT LIP REPAIR  1987     Current Outpatient Medications on File Prior to Visit  Medication Sig Dispense Refill  . donepezil (ARICEPT) 5 MG tablet TAKE 1 TABLET(5 MG) BY MOUTH AT BEDTIME 30 tablet 2  . gabapentin (NEURONTIN) 100 MG capsule Take 1 capsule (100 mg total) by mouth 2 (two) times daily. (Patient taking differently: Take 100 mg by mouth 2 (two) times daily. PER Neurosurgery-06/06/20- 100 mg QID) 60 capsule 5  . Homeopathic Products (ARNICARE) GEL Apply topically in the morning, at noon, in the evening, and at bedtime.    . lamoTRIgine (LAMICTAL) 25 MG tablet Start with 25 mg bid by mouth for 14 days, increase every 14 days by 25 mg twice daily to 50 mg, 75 mg and 100 mg bid (Patient taking differently: Take 100 mg by mouth 2 (two) times daily.) 240 tablet 3  . levETIRAcetam (KEPPRA) 500 MG tablet Take 1 tablet (500 mg total) by mouth 2 (two) times daily. 60 tablet 5  . Multiple Vitamin (MULTIVITAMIN) tablet Take 1 tablet by mouth daily. With folic acid and iron    . OIL OF OREGANO PO Take by mouth.    . Omega-3 1000 MG CAPS Take 1 capsule by mouth daily.     Marland Kitchen OVER THE COUNTER MEDICATION 1 tablet daily. Curamed.    . peppermint oil liquid by Does not apply route as needed (headache).     No current facility-administered medications on file prior to visit.    Allergies  Allergen Reactions  . Sertraline Nausea And Vomiting    Physical  exam:  Today's Vitals   06/13/20 1105  BP: 110/71  Pulse: (!) 57  Weight: 144 lb (65.3 kg)  Height: 5' 5.5" (1.664 m)   Body mass index is 23.6 kg/m.   Wt Readings from Last 3 Encounters:  06/13/20 144 lb (65.3 kg)  04/16/20 144 lb (65.3 kg)  03/19/20 151 lb (68.5 kg)     Ht Readings from Last 3 Encounters:  06/13/20 5' 5.5" (1.664 m)  04/16/20 5' 5.5" (1.664 m)  03/19/20 5\' 6"  (1.676 m)      General: The patient is awake, alert and appears not in acute distress. The patient is well groomed. Head: Normocephalic, but traumatic. Clef lip -  Neck is supple. Mallampati ,  Cardiovascular:  Regular rate and cardiac rhythm by pulse,  without distended neck veins. Respiratory: Lungs are clear to auscultation.  Skin:  Without evidence of ankle edema, or rash. Trunk: The patient's posture is erect.   Neurologic exam : The patient is awake and alert, oriented to place and time.   Memory subjective described as intact.  Attention span & concentration ability appears normal.  Speech is fluent,  without  dysarthria, dysphonia or aphasia.  Mood and affect are appropriate.   Cranial nerves: no loss of smell or taste reported  Pupils are equal and briskly reactive to light. Funduscopic exam revealed no oedema or palor or bleed.  Extraocular movements in vertical and horizontal planes were intact and without nystagmus. No Diplopia. Visual fields by finger perimetry are intact. Hearing was intact to soft voice and finger rubbing.    Facial sensation reduced to fine touch though out her right face- right scalp, right orbit. .  Facial motor  strength is symmetric and tongue and uvula move midline.  Neck ROM : rotation, tilt and flexion extension were normal for age and shoulder shrug was symmetrical.    Motor exam:  Symmetric bulk, tone and ROM.   Normal tone without cog wheeling, symmetric grip strength .   Sensory:  Fine touch, pinprick and vibration were tested  and  normal.   Proprioception tested in the upper extremities was normal.   Coordination: Rapid alternating movements in the fingers/hands were of normal speed.  The Finger-to-nose maneuver was intact without evidence of ataxia, dysmetria or tremor.   Gait and station: Patient could rise unassisted from a seated position, walked without assistive device.  Stance is of normal width/ base and the patient turned with 3 steps.  Toe and heel walk were deferred.  Deep tendon reflexes: in the  upper and lower extremities are symmetric and intact. Patella was very brisk.  Babinski response was deferred .        After spending a total time of  45  minutes face to face -mostly time for physical and neurologic examination, and additional 15- 20 minutes-  review of laboratory studies,  personal review of imaging studies, reports and results of other testing and review of referral information / records as far as provided in visit, I have established the following assessments:  1) TBI with vomiting , LOC and post concussion , subacute phase. 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   2) The patient has the classic post traumatic symptoms of amnestic spells,  memory lapses for at least a couple of minutes before she ever entered the store in which she finally fell, seized, and hit her head,   She has had some short-term memory difficulty since but they are improving.  She has had 2 seizures post traumatic. She continues to have posttraumatic headaches and MRI indicated a right orbital floor fracture with orbital fat protruding  scalp tenderness and looking at the documented bony injuries -I am very happy to see that her ocular movements are intact there is no nystagmus, and no evidence of retro-orbital bleed. Marland Kitchen.   3)  lumbar cyst is not a cause or sequelae of her falls and seizures.     My Plan is to proceed with:  Keppra to be continued - 500 mg bid.  I will ask her to take both seizure medication- Lamictal 100 mgand Keppra 500 mg at midnight - this would be taken with 2 sips of water , this is an extra dose- the surgery, start regimen again as soon as y ou are able to  swallow safely.  We may d/c keppra after she reaches 100 mg bid on lamictal.  Gabapentin has been used for twitching and pain in hands and feet- will not take her off , given how well it had worked for this indication.    The patient can return to work with 6 month driving restriction- and restrictions to operate machinery.  I had started her on aricept which has anecdotal evidence of improving memory in TBI patient.  She feels iy helps and will give it for another 3 month.  Facial and dental pain to be treated with low dose gabapentin- she will continue.   I would like to thank Dr. Maurice Smallstergard- and    Alexis CherryWhyte, Thomas M, Md [address] [place],  [place] [id number] for allowing me to meet with and to take  care of this pleasant patient.   In short, Alexis Woods is presenting with a concussion and abnormal MRI and EEG-   I plan to follow up either personally or through our NP within 2-3 month.   CC: I will share my notes with PCP   Electronically signed by: Alexis Novas, MD 06/13/2020 11:29 AM  [facility] Board certified by The [employer] of Sleep Medicine and Diplomate of the [employer] of Sleep Medicine. Board certified In Neurology through the ABPN, Fellow of the [employer] of Neurology. Medical Director of [facility].

## 2020-06-13 NOTE — Patient Instructions (Signed)
I will ask Alexis Woods to take her current low dose of Keppra not just at the p.m. time of 10 PM but also at midnight again the same is true for her p.o. dose of Lamictal-lamotrigine.  100 mg will be taken at about 10 PM and again at midnight just with a sip of water.  Based on this she would not need any IV bridge over medication and she should be protected from any seizure activity until she is post surgically able to resume her previous p.o. regimen.

## 2020-06-18 ENCOUNTER — Encounter (HOSPITAL_COMMUNITY)
Admission: RE | Admit: 2020-06-18 | Discharge: 2020-06-18 | Disposition: A | Discharge status: Home / Self Care | Payer: 59 | Source: Ambulatory Visit | Attending: Neurological Surgery | Admitting: Neurological Surgery

## 2020-06-18 ENCOUNTER — Other Ambulatory Visit (HOSPITAL_COMMUNITY)
Admission: RE | Admit: 2020-06-18 | Discharge: 2020-06-18 | Disposition: A | Discharge status: Home / Self Care | Payer: 59 | Source: Ambulatory Visit | Attending: Neurological Surgery | Admitting: Neurological Surgery

## 2020-06-18 ENCOUNTER — Encounter (HOSPITAL_COMMUNITY): Payer: Self-pay

## 2020-06-18 ENCOUNTER — Other Ambulatory Visit: Payer: Self-pay

## 2020-06-18 DIAGNOSIS — Z01812 Encounter for preprocedural laboratory examination: Secondary | ICD-10-CM | POA: Insufficient documentation

## 2020-06-18 DIAGNOSIS — Z20822 Contact with and (suspected) exposure to covid-19: Secondary | ICD-10-CM | POA: Insufficient documentation

## 2020-06-18 LAB — CBC
HCT: 47.5 % — ABNORMAL HIGH (ref 36.0–46.0)
Hemoglobin: 15.8 g/dL — ABNORMAL HIGH (ref 12.0–15.0)
MCH: 28.1 pg (ref 26.0–34.0)
MCHC: 33.3 g/dL (ref 30.0–36.0)
MCV: 84.5 fL (ref 80.0–100.0)
Platelets: 292 10*3/uL (ref 150–400)
RBC: 5.62 MIL/uL — ABNORMAL HIGH (ref 3.87–5.11)
RDW: 15.9 % — ABNORMAL HIGH (ref 11.5–15.5)
WBC: 5.7 10*3/uL (ref 4.0–10.5)
nRBC: 0 % (ref 0.0–0.2)

## 2020-06-18 LAB — SURGICAL PCR SCREEN
MRSA, PCR: NEGATIVE
Staphylococcus aureus: NEGATIVE

## 2020-06-18 NOTE — Progress Notes (Signed)
[id number] Rosalita Levan, [place] - [address] AT [facility] [address] [place] [address] Phone: [phone] Fax: [phone]      Your procedure is scheduled on 06/19/20.  Report to [facility] Main Entrance "A" at 10:10 A.M., and check in at the Admitting office.  Call this number if you have problems the morning of surgery:  [phone]  Call [phone] if you have any questions prior to your surgery date Monday-Friday 8am-4pm    Remember:  Do not eat or drink after midnight the night before your surgery    Take these medicines the morning of surgery with A SIP OF WATER: gabapentin (NEURONTIN) lamoTRIgine (LAMICTAL)  levETIRAcetam (KEPPRA)   As of today, STOP taking any Aspirin (unless otherwise instructed by your surgeon) Aleve, Naproxen, Ibuprofen, Motrin, Advil, Goody's, BC's, all herbal medications, fish oil, and all vitamins.                      Do not wear jewelry, make up, or nail polish            Do not wear lotions, powders, perfumes or deodorant.            Do not shave 48 hours prior to surgery.              Do not bring valuables to the hospital.            [facility] is not responsible for any belongings or valuables.  Do NOT Smoke (Tobacco/Vaping) or drink Alcohol 24 hours prior to your procedure If you use a CPAP at night, you may bring all equipment for your overnight stay.   Contacts, glasses, dentures or bridgework may not be worn into surgery.      For patients admitted to the hospital, discharge time will be determined by your treatment team.   Patients discharged the day of surgery will not be allowed to drive home, and someone needs to stay with them for 24 hours.    Special instructions:   [facility]- Preparing For Surgery  Before surgery, you can play an important role. Because skin is not sterile, your skin needs to be as free of germs as possible. You can reduce the  number of germs on your skin by washing with CHG (chlorahexidine gluconate) Soap before surgery.  CHG is an antiseptic cleaner which kills germs and bonds with the skin to continue killing germs even after washing.    Oral Hygiene is also important to reduce your risk of infection.  Remember - BRUSH YOUR TEETH THE MORNING OF SURGERY WITH YOUR REGULAR TOOTHPASTE  Please do not use if you have an allergy to CHG or antibacterial soaps. If your skin becomes reddened/irritated stop using the CHG.  Do not shave (including legs and underarms) for at least 48 hours prior to first CHG shower. It is OK to shave your face.  Please follow these instructions carefully.   1. Shower the NIGHT BEFORE SURGERY and the MORNING OF SURGERY with CHG Soap.   2. If you chose to wash your hair, wash your hair first as usual with your normal shampoo.  3. After you shampoo, rinse your hair and body thoroughly to remove the shampoo.  4. Use CHG as you would any other liquid soap. You can apply CHG directly to the skin and wash gently with a scrungie or a clean washcloth.   5. Apply  the CHG Soap to your body ONLY FROM THE NECK DOWN.  Do not use on open wounds or open sores. Avoid contact with your eyes, ears, mouth and genitals (private parts). Wash Face and genitals (private parts)  with your normal soap.   6. Wash thoroughly, paying special attention to the area where your surgery will be performed.  7. Thoroughly rinse your body with warm water from the neck down.  8. DO NOT shower/wash with your normal soap after using and rinsing off the CHG Soap.  9. Pat yourself dry with a CLEAN TOWEL.  10. Wear CLEAN PAJAMAS to bed the night before surgery  11. Place CLEAN SHEETS on your bed the night of your first shower and DO NOT SLEEP WITH PETS.   Day of Surgery: Wear Clean/Comfortable clothing the morning of surgery Do not apply any deodorants/lotions.   Remember to brush your teeth WITH YOUR REGULAR TOOTHPASTE.    Please read over the following fact sheets that you were given.

## 2020-06-18 NOTE — Progress Notes (Signed)
PCP:  Dr. Arlan Organ, MD Cardiologist:  Denies  EKG:  N/A CXR:  N/A ECHO:  Denies Stress Test:  Denies Cardiac Cath:  Denies  Fasting Blood Sugar-  N/A Checks Blood Sugar_N/A__ times a day  OSA/CPAP:  No  ASA/Blood Thinners:  No  Covid test 12/20  Anesthesia Review:  No  Patient denies shortness of breath, fever, cough, and chest pain at PAT appointment.  Patient verbalized understanding of instructions provided today at the PAT appointment.  Patient asked to review instructions at home and day of surgery.

## 2020-06-19 ENCOUNTER — Observation Stay (HOSPITAL_COMMUNITY)
Admission: RE | Admit: 2020-06-19 | Discharge: 2020-06-20 | Disposition: A | Discharge status: Home / Self Care | Payer: 59 | Attending: Neurological Surgery | Admitting: Neurological Surgery

## 2020-06-19 ENCOUNTER — Other Ambulatory Visit: Payer: Self-pay

## 2020-06-19 ENCOUNTER — Ambulatory Visit (HOSPITAL_COMMUNITY): Payer: 59 | Admitting: Student

## 2020-06-19 ENCOUNTER — Ambulatory Visit (HOSPITAL_COMMUNITY): Payer: 59

## 2020-06-19 ENCOUNTER — Encounter (HOSPITAL_COMMUNITY)
Admission: RE | Disposition: A | Discharge status: Home / Self Care | Payer: Self-pay | Source: Home / Self Care | Attending: Neurological Surgery

## 2020-06-19 ENCOUNTER — Encounter (HOSPITAL_COMMUNITY): Payer: Self-pay | Admitting: Neurological Surgery

## 2020-06-19 DIAGNOSIS — M7138 Other bursal cyst, other site: Secondary | ICD-10-CM | POA: Diagnosis present

## 2020-06-19 DIAGNOSIS — Z419 Encounter for procedure for purposes other than remedying health state, unspecified: Secondary | ICD-10-CM

## 2020-06-19 DIAGNOSIS — M48062 Spinal stenosis, lumbar region with neurogenic claudication: Secondary | ICD-10-CM | POA: Diagnosis not present

## 2020-06-19 HISTORY — PX: LUMBAR LAMINECTOMY/ DECOMPRESSION WITH MET-RX: SHX5959

## 2020-06-19 LAB — POCT PREGNANCY, URINE: Preg Test, Ur: NEGATIVE

## 2020-06-19 LAB — SARS CORONAVIRUS 2 (TAT 6-24 HRS): SARS Coronavirus 2: NEGATIVE

## 2020-06-19 SURGERY — LUMBAR LAMINECTOMY/ DECOMPRESSION WITH MET-RX
Anesthesia: General | Laterality: Right

## 2020-06-19 MED ORDER — ONDANSETRON HCL 4 MG/2ML IJ SOLN
INTRAMUSCULAR | Status: AC
  Filled 2020-06-19: qty 2

## 2020-06-19 MED ORDER — MENTHOL 3 MG MT LOZG: 1.0000 | LOZENGE | OROMUCOSAL | Status: DC | PRN

## 2020-06-19 MED ORDER — DEXAMETHASONE SODIUM PHOSPHATE 10 MG/ML IJ SOLN
INTRAMUSCULAR | Status: DC | PRN
  Administered 2020-06-19: 10 mg via INTRAVENOUS

## 2020-06-19 MED ORDER — PHENOL 1.4 % MT LIQD: 1.0000 | OROMUCOSAL | Status: DC | PRN

## 2020-06-19 MED ORDER — ARNICA MONTANA PO PLLT: PELLET | Freq: Three times a day (TID) | ORAL | Status: DC | PRN

## 2020-06-19 MED ORDER — CYCLOBENZAPRINE HCL 10 MG PO TABS: 10.0000 mg | ORAL_TABLET | Freq: Three times a day (TID) | ORAL | Status: DC | PRN

## 2020-06-19 MED ORDER — SODIUM CHLORIDE 0.9 % IV SOLN
250.0000 mL | INTRAVENOUS | Status: DC
  Administered 2020-06-19: 18:00:00 250 mL via INTRAVENOUS

## 2020-06-19 MED ORDER — ONDANSETRON HCL 4 MG PO TABS: 4.0000 mg | ORAL_TABLET | Freq: Four times a day (QID) | ORAL | Status: DC | PRN

## 2020-06-19 MED ORDER — FENTANYL CITRATE (PF) 250 MCG/5ML IJ SOLN
INTRAMUSCULAR | Status: AC
  Filled 2020-06-19: qty 5

## 2020-06-19 MED ORDER — CHLORHEXIDINE GLUCONATE 0.12 % MT SOLN
OROMUCOSAL | Status: AC
  Administered 2020-06-19: 11:00:00 15 mL via OROMUCOSAL
  Filled 2020-06-19: qty 15

## 2020-06-19 MED ORDER — DOUBLE ANTIBIOTIC 500-10000 UNIT/GM EX OINT
1.0000 "application " | TOPICAL_OINTMENT | Freq: Every day | CUTANEOUS | Status: DC | PRN
  Filled 2020-06-19: qty 28.4

## 2020-06-19 MED ORDER — CEFAZOLIN SODIUM-DEXTROSE 2-4 GM/100ML-% IV SOLN
2.0000 g | INTRAVENOUS | Status: AC
  Administered 2020-06-19: 13:00:00 2 g via INTRAVENOUS

## 2020-06-19 MED ORDER — ORAL CARE MOUTH RINSE: 15.0000 mL | Freq: Once | OROMUCOSAL | Status: DC

## 2020-06-19 MED ORDER — FENTANYL CITRATE (PF) 250 MCG/5ML IJ SOLN
INTRAMUSCULAR | Status: DC | PRN
  Administered 2020-06-19 (×2): 50 ug via INTRAVENOUS

## 2020-06-19 MED ORDER — LACTATED RINGERS IV SOLN: INTRAVENOUS | Status: DC

## 2020-06-19 MED ORDER — CHLORHEXIDINE GLUCONATE CLOTH 2 % EX PADS: 6.0000 | MEDICATED_PAD | Freq: Once | CUTANEOUS | Status: DC

## 2020-06-19 MED ORDER — LIDOCAINE 2% (20 MG/ML) 5 ML SYRINGE
INTRAMUSCULAR | Status: DC | PRN
  Administered 2020-06-19: 80 mg via INTRAVENOUS

## 2020-06-19 MED ORDER — OXYCODONE HCL 5 MG PO TABS
10.0000 mg | ORAL_TABLET | ORAL | Status: DC | PRN
  Administered 2020-06-19 – 2020-06-20 (×4): 10 mg via ORAL
  Filled 2020-06-19 (×4): qty 2

## 2020-06-19 MED ORDER — ONDANSETRON HCL 4 MG/2ML IJ SOLN: 4.0000 mg | Freq: Four times a day (QID) | INTRAMUSCULAR | Status: DC | PRN

## 2020-06-19 MED ORDER — PROPOFOL 10 MG/ML IV BOLUS
INTRAVENOUS | Status: AC
  Filled 2020-06-19: qty 20

## 2020-06-19 MED ORDER — LAMOTRIGINE 100 MG PO TABS
100.0000 mg | ORAL_TABLET | Freq: Two times a day (BID) | ORAL | Status: DC
  Administered 2020-06-19 – 2020-06-20 (×2): 100 mg via ORAL
  Filled 2020-06-19 (×3): qty 1

## 2020-06-19 MED ORDER — 0.9 % SODIUM CHLORIDE (POUR BTL) OPTIME
TOPICAL | Status: DC | PRN
  Administered 2020-06-19: 13:00:00 1000 mL

## 2020-06-19 MED ORDER — THROMBIN 5000 UNITS EX SOLR
CUTANEOUS | Status: AC
  Filled 2020-06-19: qty 5000

## 2020-06-19 MED ORDER — DONEPEZIL HCL 5 MG PO TABS
5.0000 mg | ORAL_TABLET | Freq: Every day | ORAL | Status: DC
  Administered 2020-06-19: 20:00:00 5 mg via ORAL
  Filled 2020-06-19 (×2): qty 1

## 2020-06-19 MED ORDER — LEVETIRACETAM 250 MG PO TABS
500.0000 mg | ORAL_TABLET | Freq: Two times a day (BID) | ORAL | Status: DC
  Administered 2020-06-19 – 2020-06-20 (×2): 500 mg via ORAL
  Filled 2020-06-19 (×2): qty 2

## 2020-06-19 MED ORDER — LIDOCAINE-EPINEPHRINE 1 %-1:100000 IJ SOLN
INTRAMUSCULAR | Status: AC
  Filled 2020-06-19: qty 1

## 2020-06-19 MED ORDER — EPHEDRINE SULFATE 50 MG/ML IJ SOLN
INTRAMUSCULAR | Status: DC | PRN
  Administered 2020-06-19: 10 mg via INTRAVENOUS

## 2020-06-19 MED ORDER — TRAMADOL HCL 50 MG PO TABS
50.0000 mg | ORAL_TABLET | Freq: Four times a day (QID) | ORAL | Status: DC | PRN
  Administered 2020-06-19: 50 mg via ORAL
  Filled 2020-06-19: qty 1

## 2020-06-19 MED ORDER — ONDANSETRON HCL 4 MG/2ML IJ SOLN
INTRAMUSCULAR | Status: DC | PRN
  Administered 2020-06-19: 4 mg via INTRAVENOUS

## 2020-06-19 MED ORDER — GABAPENTIN 100 MG PO CAPS
100.0000 mg | ORAL_CAPSULE | Freq: Four times a day (QID) | ORAL | Status: DC
  Administered 2020-06-19 – 2020-06-20 (×3): 100 mg via ORAL
  Filled 2020-06-19 (×3): qty 1

## 2020-06-19 MED ORDER — CEFAZOLIN SODIUM-DEXTROSE 2-4 GM/100ML-% IV SOLN
2.0000 g | Freq: Three times a day (TID) | INTRAVENOUS | Status: AC
  Administered 2020-06-19 – 2020-06-20 (×2): 2 g via INTRAVENOUS
  Filled 2020-06-19 (×2): qty 100

## 2020-06-19 MED ORDER — ROCURONIUM BROMIDE 10 MG/ML (PF) SYRINGE
PREFILLED_SYRINGE | INTRAVENOUS | Status: DC | PRN
  Administered 2020-06-19: 30 mg via INTRAVENOUS
  Administered 2020-06-19: 50 mg via INTRAVENOUS
  Administered 2020-06-19: 20 mg via INTRAVENOUS

## 2020-06-19 MED ORDER — CEFAZOLIN SODIUM-DEXTROSE 2-4 GM/100ML-% IV SOLN
INTRAVENOUS | Status: AC
  Filled 2020-06-19: qty 100

## 2020-06-19 MED ORDER — ACETAMINOPHEN 325 MG PO TABS: 650.0000 mg | ORAL_TABLET | ORAL | Status: DC | PRN

## 2020-06-19 MED ORDER — ACETAMINOPHEN 650 MG RE SUPP: 650.0000 mg | RECTAL | Status: DC | PRN

## 2020-06-19 MED ORDER — SALINE SPRAY 0.65 % NA SOLN
1.0000 | NASAL | Status: DC | PRN
  Filled 2020-06-19: qty 44

## 2020-06-19 MED ORDER — SODIUM CHLORIDE 0.9 % IV SOLN
INTRAVENOUS | Status: DC | PRN
  Administered 2020-06-19: 13:00:00 40 ug/min via INTRAVENOUS

## 2020-06-19 MED ORDER — MIDAZOLAM HCL 2 MG/2ML IJ SOLN
INTRAMUSCULAR | Status: AC
  Filled 2020-06-19: qty 2

## 2020-06-19 MED ORDER — SODIUM CHLORIDE 0.9% FLUSH
3.0000 mL | Freq: Two times a day (BID) | INTRAVENOUS | Status: DC
  Administered 2020-06-19: 20:00:00 3 mL via INTRAVENOUS

## 2020-06-19 MED ORDER — TRAMADOL HCL 50 MG PO TABS
50.0000 mg | ORAL_TABLET | ORAL | Status: DC | PRN
  Administered 2020-06-19: 50 mg via ORAL

## 2020-06-19 MED ORDER — PROPOFOL 10 MG/ML IV BOLUS
INTRAVENOUS | Status: DC | PRN
  Administered 2020-06-19: 160 mg via INTRAVENOUS
  Administered 2020-06-19: 20 mg via INTRAVENOUS

## 2020-06-19 MED ORDER — SODIUM CHLORIDE 0.9% FLUSH: 3.0000 mL | INTRAVENOUS | Status: DC | PRN

## 2020-06-19 MED ORDER — FENTANYL CITRATE (PF) 100 MCG/2ML IJ SOLN: 25.0000 ug | INTRAMUSCULAR | Status: DC | PRN

## 2020-06-19 MED ORDER — OXYCODONE HCL 5 MG PO TABS: 5.0000 mg | ORAL_TABLET | ORAL | Status: DC | PRN

## 2020-06-19 MED ORDER — ORAL CARE MOUTH RINSE: 15.0000 mL | Freq: Once | OROMUCOSAL | Status: AC

## 2020-06-19 MED ORDER — DOCUSATE SODIUM 100 MG PO CAPS
100.0000 mg | ORAL_CAPSULE | Freq: Two times a day (BID) | ORAL | Status: DC
  Administered 2020-06-19 – 2020-06-20 (×2): 100 mg via ORAL
  Filled 2020-06-19 (×2): qty 1

## 2020-06-19 MED ORDER — ADULT MULTIVITAMIN W/MINERALS CH
1.0000 | ORAL_TABLET | Freq: Every day | ORAL | Status: DC
  Administered 2020-06-20: 10:00:00 1 via ORAL
  Filled 2020-06-19 (×3): qty 1

## 2020-06-19 MED ORDER — ARNICARE ARNICA EX CREA: 1.0000 "application " | TOPICAL_CREAM | Freq: Every day | CUTANEOUS | Status: DC | PRN

## 2020-06-19 MED ORDER — CHLORHEXIDINE GLUCONATE 0.12 % MT SOLN: 15.0000 mL | Freq: Once | OROMUCOSAL | Status: AC

## 2020-06-19 MED ORDER — POLYETHYLENE GLYCOL 3350 17 G PO PACK: 17.0000 g | PACK | Freq: Every day | ORAL | Status: DC | PRN

## 2020-06-19 MED ORDER — SUGAMMADEX SODIUM 200 MG/2ML IV SOLN
INTRAVENOUS | Status: DC | PRN
  Administered 2020-06-19: 200 mg via INTRAVENOUS

## 2020-06-19 MED ORDER — ACETAMINOPHEN 500 MG PO TABS
1000.0000 mg | ORAL_TABLET | Freq: Four times a day (QID) | ORAL | Status: DC
  Administered 2020-06-19 – 2020-06-20 (×3): 1000 mg via ORAL
  Filled 2020-06-19 (×3): qty 2

## 2020-06-19 MED ORDER — HYDROMORPHONE HCL 1 MG/ML IJ SOLN: 1.0000 mg | INTRAMUSCULAR | Status: DC | PRN

## 2020-06-19 MED ORDER — THROMBIN 5000 UNITS EX SOLR
OROMUCOSAL | Status: DC | PRN
  Administered 2020-06-19: 13:00:00 5 mL via TOPICAL

## 2020-06-19 MED ORDER — LIDOCAINE-EPINEPHRINE 1 %-1:100000 IJ SOLN
INTRAMUSCULAR | Status: DC | PRN
  Administered 2020-06-19: 10 mL

## 2020-06-19 MED ORDER — CHLORHEXIDINE GLUCONATE 0.12 % MT SOLN: 15.0000 mL | Freq: Once | OROMUCOSAL | Status: DC

## 2020-06-19 MED ORDER — PHENYLEPHRINE 40 MCG/ML (10ML) SYRINGE FOR IV PUSH (FOR BLOOD PRESSURE SUPPORT)
PREFILLED_SYRINGE | INTRAVENOUS | Status: DC | PRN
  Administered 2020-06-19 (×2): 80 ug via INTRAVENOUS

## 2020-06-19 MED ORDER — HYDROCORTISONE 1 % EX CREA
1.0000 "application " | TOPICAL_CREAM | Freq: Two times a day (BID) | CUTANEOUS | Status: DC | PRN
  Filled 2020-06-19: qty 28

## 2020-06-19 SURGICAL SUPPLY — 55 items
BAND RUBBER #18 3X1/16 STRL (MISCELLANEOUS) ×6 IMPLANT
BLADE CLIPPER SURG (BLADE) IMPLANT
BLADE SURG 11 STRL SS (BLADE) ×3 IMPLANT
BUR 2.5 MTCH HD 16 (BUR) ×2 IMPLANT
BUR 2.5MM MTCH HD 16CM (BUR) ×1
BUR PRECISION FLUTE 5.0 (BURR) IMPLANT
BUR PRECISION MATCH 2.5 (BURR) IMPLANT
BUR PRECISION MATCH 3.0 13 (BURR) ×2 IMPLANT
BUR PRECISION MATCH 3.0 13CM (BURR) ×1
CANISTER SUCT 3000ML PPV (MISCELLANEOUS) ×3 IMPLANT
COVER WAND RF STERILE (DRAPES) ×3 IMPLANT
DECANTER SPIKE VIAL GLASS SM (MISCELLANEOUS) ×3 IMPLANT
DERMABOND ADVANCED (GAUZE/BANDAGES/DRESSINGS) ×2
DERMABOND ADVANCED .7 DNX12 (GAUZE/BANDAGES/DRESSINGS) ×1 IMPLANT
DRAPE C-ARM 42X72 X-RAY (DRAPES) ×6 IMPLANT
DRAPE LAPAROTOMY 100X72X124 (DRAPES) ×3 IMPLANT
DRAPE MICROSCOPE LEICA (MISCELLANEOUS) ×3 IMPLANT
DRAPE SURG 17X23 STRL (DRAPES) ×3 IMPLANT
DURAPREP 26ML APPLICATOR (WOUND CARE) ×3 IMPLANT
ELECT BLADE 6.5 EXT (BLADE) ×3 IMPLANT
ELECT REM PT RETURN 9FT ADLT (ELECTROSURGICAL) ×3
ELECTRODE REM PT RTRN 9FT ADLT (ELECTROSURGICAL) ×1 IMPLANT
GAUZE 4X4 16PLY RFD (DISPOSABLE) IMPLANT
GAUZE SPONGE 4X4 12PLY STRL (GAUZE/BANDAGES/DRESSINGS) IMPLANT
GLOVE BIO SURGEON STRL SZ 6.5 (GLOVE) ×2 IMPLANT
GLOVE BIO SURGEON STRL SZ7.5 (GLOVE) ×3 IMPLANT
GLOVE BIO SURGEONS STRL SZ 6.5 (GLOVE) ×1
GLOVE BIOGEL PI IND STRL 6.5 (GLOVE) ×1 IMPLANT
GLOVE BIOGEL PI IND STRL 7.5 (GLOVE) ×1 IMPLANT
GLOVE BIOGEL PI INDICATOR 6.5 (GLOVE) ×2
GLOVE BIOGEL PI INDICATOR 7.5 (GLOVE) ×2
GLOVE EXAM NITRILE LRG STRL (GLOVE) IMPLANT
GLOVE EXAM NITRILE XL STR (GLOVE) IMPLANT
GLOVE EXAM NITRILE XS STR PU (GLOVE) IMPLANT
GOWN STRL REUS W/ TWL LRG LVL3 (GOWN DISPOSABLE) ×2 IMPLANT
GOWN STRL REUS W/ TWL XL LVL3 (GOWN DISPOSABLE) ×1 IMPLANT
GOWN STRL REUS W/TWL 2XL LVL3 (GOWN DISPOSABLE) IMPLANT
GOWN STRL REUS W/TWL LRG LVL3 (GOWN DISPOSABLE) ×6
GOWN STRL REUS W/TWL XL LVL3 (GOWN DISPOSABLE) ×3
HEMOSTAT POWDER KIT SURGIFOAM (HEMOSTASIS) ×3 IMPLANT
KIT BASIN OR (CUSTOM PROCEDURE TRAY) ×3 IMPLANT
KIT TURNOVER KIT B (KITS) ×3 IMPLANT
NEEDLE HYPO 22GX1.5 SAFETY (NEEDLE) ×3 IMPLANT
NEEDLE SPNL 18GX3.5 QUINCKE PK (NEEDLE) ×3 IMPLANT
NS IRRIG 1000ML POUR BTL (IV SOLUTION) ×3 IMPLANT
PACK LAMINECTOMY NEURO (CUSTOM PROCEDURE TRAY) ×3 IMPLANT
PAD ARMBOARD 7.5X6 YLW CONV (MISCELLANEOUS) IMPLANT
SPONGE LAP 4X18 RFD (DISPOSABLE) IMPLANT
SUT MNCRL AB 3-0 PS2 18 (SUTURE) IMPLANT
SUT VIC AB 0 CT1 18XCR BRD8 (SUTURE) IMPLANT
SUT VIC AB 0 CT1 8-18 (SUTURE)
SUT VIC AB 2-0 CP2 18 (SUTURE) ×3 IMPLANT
TOWEL GREEN STERILE (TOWEL DISPOSABLE) ×3 IMPLANT
TOWEL GREEN STERILE FF (TOWEL DISPOSABLE) ×3 IMPLANT
WATER STERILE IRR 1000ML POUR (IV SOLUTION) ×3 IMPLANT

## 2020-06-19 NOTE — Progress Notes (Signed)
PHARMACIST - PHYSICIAN ORDER COMMUNICATION  CONCERNING: P&T Medication Policy on Herbal Medications  DESCRIPTION:  This patient's order for:  Arnica  has been noted.  This product(s) is classified as an "herbal" or natural product. Due to a lack of definitive safety studies or FDA approval, nonstandard manufacturing practices, plus the potential risk of unknown drug-drug interactions while on inpatient medications, the Pharmacy and Therapeutics Committee does not permit the use of "herbal" or natural products of this type within [facility].   ACTION TAKEN: The pharmacy department is unable to verify this order at this time and your patient has been informed of this safety policy. Please reevaluate patient's clinical condition at discharge and address if the herbal or natural product(s) should be resumed at that time.

## 2020-06-19 NOTE — Transfer of Care (Addendum)
Immediate Anesthesia Transfer of Care Note  Patient: Alexis Woods  Procedure(s) Performed: Right Lumbar four-five Minimally invasive decompression and synovial cyst resection (Right )  Patient Location: PACU  Anesthesia Type:General  Level of Consciousness: sedated and drowsy  Airway & Oxygen Therapy: Patient Spontanous Breathing and Patient connected to face mask oxygen  Post-op Assessment: Report given to RN and Post -op Vital signs reviewed and stable  Post vital signs: Reviewed and stable  Last Vitals:  Vitals Value Taken Time  BP 113/69 06/19/20 1550  Temp    Pulse 72 06/19/20 1554  Resp 12 06/19/20 1554  SpO2 100 % 06/19/20 1554  Vitals shown include unvalidated device data.  Last Pain:  Vitals:   06/19/20 1101  TempSrc:   PainSc: 3       Patients Stated Pain Goal: 3 (06/19/20 1101)  Complications: No complications documented.

## 2020-06-19 NOTE — H&P (Signed)
Surgical H&P Update  HPI: 54 y.o. 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 No changes in health since she was last seen. Still having symptoms and wishes to proceed with surgery.  PMHx:  Past Medical History:  Diagnosis Date  . Seizure (HCC)    FamHx: History reviewed. No pertinent family history. SocHx:  reports that she has never smoked. She has never used smokeless tobacco. She reports previous alcohol use. She reports previous drug use.  Physical Exam: AOx3, PERRL, FS, TM  Strength 5/5 x4, SILTx4 except R L5 distribution numbness  Assesment/Plan: 54 y.o. 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 Risks, benefits, and alternatives discussed and the patient would like to continue with surgery.  -OR today -home from PACU versus 3C post-op  Jadene Pierini, MD 06/19/20 12:26 PM

## 2020-06-19 NOTE — Op Note (Signed)
PATIENT: Alexis Woods  DAY OF SURGERY: 06/19/20   PRE-OPERATIVE DIAGNOSIS:  Spinal stenosis with neurogenic claudication, synovial cyst   POST-OPERATIVE DIAGNOSIS:  Same   PROCEDURE:  Right minimally invasive L4-5 laminectomy and synovial cyst resection   SURGEON:  Surgeon(s) and Role:    Jadene Pierini, MD - Primary   ANESTHESIA: ETGA   BRIEF HISTORY: This is a 54 year old woman who presented with worsening right sided radicular symptoms, MRI showed a large synovial cyst with severe canal stenosis and nerve root compression. I therefore recommended a minimally invasive L4-5 laminectomy with resection of the cyst. This was discussed with the patient as well as risks, benefits, and alternatives and the patient wished to proceed with surgical treatment.    OPERATIVE DETAIL: The patient was taken to the operating room and placed on the OR table in the prone position. A formal time out was performed with two patient identifiers and confirmed the operative site. Anesthesia was induced by the anesthesia team. The operative site was marked, hair was clipped with surgical clippers, the area was then prepped and draped in a sterile fashion. Fluoroscopy was used to identify the surgical level prior to incision.   A 2cm incision was then marked 2cm off to the right of midline. The fascia was incised sharply and serial dilators were docked to the lamino-facet junction using fluoroscopy to confirm position as well as perform a second count to confirm the correct surgical level. After a final dilator was placed, a tubular retractor was placed over this and secured to the table. The operating microscope was draped and brought into the field. Anatomy was palpated and confirmed, monopolar cautery was used to expose the facet, lamina, and a portion of the spinous process to confirm orientation. A high speed drill and kerrison rongeurs were then used to create a hemilaminotomy and small medial facetectomy.  A  cyst was immediately evident and adherent to the thecal sac. There was initially some clear fluid upon entering it, but the majority of the contents were more mucoid in appearance and consistency. The cyst wall and some of the contents were therefore sent to pathology for further analysis. After carefully elevating the cyst, the traversing nerve root was identified and decompressed fully. The thecal sac was well decompressed and there was no obvious visible cyst remnant remaining, just some small portions of capsule that were adherent to the dura.   Hemostasis was obtained, the wound was copiously irrigated, and the tube was removed while using the microscope to confirm hemostasis of the muscle edges. All instrument and sponge counts were correct and the incision was then closed in layers. The patient was then returned to anesthesia for emergence. No apparent complications at the completion of the procedure.    EBL:  35mL   DRAINS: none   SPECIMENS: Synovial cyst   Jadene Pierini, MD 06/19/20 3:35 PM

## 2020-06-19 NOTE — Anesthesia Procedure Notes (Signed)
Procedure Name: Intubation Date/Time: 06/19/2020 1:09 PM Performed by: Amadeo Garnet, CRNA Pre-anesthesia Checklist: Patient identified, Emergency Drugs available, Suction available and Patient being monitored Patient Re-evaluated:Patient Re-evaluated prior to induction Oxygen Delivery Method: Circle system utilized Preoxygenation: Pre-oxygenation with 100% oxygen Induction Type: IV induction Ventilation: Mask ventilation without difficulty Laryngoscope Size: Mac and 3 Grade View: Grade I Tube type: Oral Tube size: 7.5 mm Number of attempts: 1 Airway Equipment and Method: Stylet and Oral airway Placement Confirmation: ETT inserted through vocal cords under direct vision,  positive ETCO2 and breath sounds checked- equal and bilateral Secured at: 22 cm Tube secured with: Tape Dental Injury: Teeth and Oropharynx as per pre-operative assessment  Comments: Atraumatic intubation.

## 2020-06-19 NOTE — Anesthesia Preprocedure Evaluation (Addendum)
Anesthesia Evaluation  Patient identified by MRN, date of birth, ID band Patient awake    Reviewed: Allergy & Precautions, NPO status , Patient's Chart, lab work & pertinent test results  Airway Mallampati: II  TM Distance: >3 FB     Dental   Pulmonary    breath sounds clear to auscultation       Cardiovascular negative cardio ROS   Rhythm:Regular Rate:Normal     Neuro/Psych  Headaches, Seizures -,   Neuromuscular disease    GI/Hepatic Neg liver ROS, GERD  ,  Endo/Other  negative endocrine ROS  Renal/GU negative Renal ROS     Musculoskeletal   Abdominal   Peds  Hematology   Anesthesia Other Findings   Reproductive/Obstetrics                             Anesthesia Physical Anesthesia Plan  ASA: III  Anesthesia Plan: General   Post-op Pain Management:    Induction: Intravenous  PONV Risk Score and Plan: 3  Airway Management Planned: Oral ETT  Additional Equipment:   Intra-op Plan:   Post-operative Plan: Possible Post-op intubation/ventilation  Informed Consent: I have reviewed the patients History and Physical, chart, labs and discussed the procedure including the risks, benefits and alternatives for the proposed anesthesia with the patient or authorized representative who has indicated his/her understanding and acceptance.     Dental advisory given  Plan Discussed with: CRNA and Anesthesiologist  Anesthesia Plan Comments:         Anesthesia Quick Evaluation

## 2020-06-19 NOTE — Anesthesia Postprocedure Evaluation (Signed)
Anesthesia Post Note  Patient: Alexis Woods  Procedure(s) Performed: Right Lumbar four-five Minimally invasive decompression and synovial cyst resection (Right )     Patient location during evaluation: PACU Anesthesia Type: General Level of consciousness: awake Pain management: pain level controlled Vital Signs Assessment: post-procedure vital signs reviewed and stable Respiratory status: spontaneous breathing Cardiovascular status: stable Postop Assessment: no apparent nausea or vomiting Anesthetic complications: no   No complications documented.  Last Vitals:  Vitals:   06/19/20 1029  BP: 123/74  Pulse: 63  Resp: 17  Temp: 36.7 C  SpO2: 100%    Last Pain:  Vitals:   06/19/20 1101  TempSrc:   PainSc: 3                  Kiahna Banghart

## 2020-06-19 NOTE — Brief Op Note (Signed)
06/19/2020  3:34 PM  PATIENT:  Monia Sabal Coker  54 y.o. female  PRE-OPERATIVE DIAGNOSIS:  Lumbar radiculopathy  POST-OPERATIVE DIAGNOSIS:  Lumbar radiculopathy  PROCEDURE:  Procedure(s): Right Lumbar four-five Minimally invasive decompression and synovial cyst resection (Right)  SURGEON:  Surgeon(s) and Role:    * Jadene Pierini, MD - Primary  PHYSICIAN ASSISTANT:   ASSISTANTS: none   ANESTHESIA:   general  EBL:  20cc  BLOOD ADMINISTERED:none  DRAINS: none   LOCAL MEDICATIONS USED:  LIDOCAINE   SPECIMEN:  Source of Specimen:  Synovial cyst  DISPOSITION OF SPECIMEN:  PATHOLOGY  COUNTS:  YES  TOURNIQUET:  * No tourniquets in log *  DICTATION: .Note written in EPIC  PLAN OF CARE: Discharge to home after PACU  PATIENT DISPOSITION:  PACU - hemodynamically stable.   Delay start of Pharmacological VTE agent (>24hrs) due to surgical blood loss or risk of bleeding: yes

## 2020-06-19 NOTE — Discharge Instructions (Signed)
Discharge Instructions ° °No restriction in activities, slowly increase your activity back to normal.  ° °Your incision is closed with dermabond (purple glue). This will naturally fall off over the next 1-2 weeks.  ° °Okay to shower on the day of discharge. Use regular soap and water and try to be gentle when cleaning your incision.  ° °Follow up with Dr. Niylah Hassan in 2 weeks after discharge. If you do not already have a discharge appointment, please call his office at [phone] to schedule a follow up appointment. If you have any concerns or questions, please call the office and let us know. °

## 2020-06-20 ENCOUNTER — Encounter (HOSPITAL_COMMUNITY): Payer: Self-pay | Admitting: Neurological Surgery

## 2020-06-20 DIAGNOSIS — M7138 Other bursal cyst, other site: Secondary | ICD-10-CM | POA: Diagnosis not present

## 2020-06-20 MED ORDER — OXYCODONE HCL 10 MG PO TABS: 10.0000 mg | ORAL_TABLET | ORAL | 0 refills | Status: DC | PRN

## 2020-06-20 NOTE — Progress Notes (Signed)
Patient alert and oriented, mae's well, voiding adequate amount of urine, swallowing without difficulty, no c/o pain at time of discharge. Patient discharged home with family. Script and discharged instructions given to patient. Patient and family stated understanding of instructions given. Patient has an appointment with Dr. Ostergard   

## 2020-06-20 NOTE — Progress Notes (Signed)
Neurosurgery Service Progress Note  Subjective: No acute events overnight. Leg pain and numbness dramatically improved, she is very pleased   Objective: Vitals:   06/20/20 0100 06/20/20 0358 06/20/20 0402 06/20/20 0803  BP: (!) 96/55 92/64 100/67 113/84  Pulse: 62 (!) 55 (!) 51 66  Resp: 16 18  16   Temp: 97.8 F (36.6 C) 97.7 F (36.5 C)  97.9 F (36.6 C)  TempSrc: Oral Oral  Oral  SpO2: 99% 100%  100%  Weight:      Height:        Physical Exam: AOx3, PERRL, EOMI, FS, TM, Strength 5/5 x4, SILTx4 Incision c/d/i  Assessment & Plan: 54 y.o. woman s/p MIS laminectomy and resection of synovial cyst, recovering well.  -discharge home today  40  06/20/20 8:20 AM

## 2020-06-20 NOTE — Evaluation (Signed)
Physical Therapy Evaluation and Discharge Patient Details Name: Alexis Woods MRN: [id number] DOB: [birth year] Today's Date: 06/20/2020   History of Present Illness  Pt is a 54 y.o. 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  Clinical Impression  Patient evaluated by Physical Therapy with no further acute PT needs identified. Pt with good pain control; denies radicular pain or numbness/tingling. Pt ambulating 400 feet with no assistive device without physical difficulty. Negotiated 10 steps with HHA to simulate home set up (pt has no rails). Education reviewed regarding spinal precautions, car transfer technique, exercise/activity recommendations and restrictions. All education has been completed and the patient has no further questions. No follow-up Physical Therapy or equipment needs. PT is signing off. Thank you for this referral.     Follow Up Recommendations No PT follow up    Equipment Recommendations  None recommended by PT    Recommendations for Other Services       Precautions / Restrictions Precautions Precautions: Back Precaution Booklet Issued: Yes (comment) Precaution Comments: Verbally reviewed, provided written handout Restrictions Weight Bearing Restrictions: No      Mobility  Bed Mobility Overal bed mobility: Modified Independent             General bed mobility comments: Pt able to perform sit > sidelying and subsequent roll onto back modI. Good log roll technique; pt self cueing    Transfers Overall transfer level: Independent Equipment used: None                Ambulation/Gait Ambulation/Gait assistance: Supervision Gait Distance (Feet): 400 Feet Assistive device: None Gait Pattern/deviations: Step-through pattern;Decreased stride length Gait velocity: decreased   General Gait Details: Mild dynamic instability, supervision for  safety. Decreased reciprocal arm swing  Stairs Stairs: Yes Stairs assistance: Min guard Stair Management: No rails Number of Stairs: 10 General stair comments: Step by step pattern, HHA for safety  Wheelchair Mobility    Modified Rankin (Stroke Patients Only)       Balance Overall balance assessment: Mild deficits observed, not formally tested                                           Pertinent Vitals/Pain Pain Assessment: Faces Faces Pain Scale: Hurts a little bit Pain Location: back Pain Descriptors / Indicators: Operative site guarding Pain Intervention(s): Monitored during session    Home Living Family/patient expects to be discharged to:: Private residence Living Arrangements: Spouse/significant other Available Help at Discharge: Family Type of Home: House Home Access: Stairs to enter Entrance Stairs-Rails: None Entrance Stairs-Number of Steps: 5 Home Layout: Able to live on main level with bedroom/bathroom Home Equipment: Grab bars - tub/shower;Walker - 2 wheels;Crutches;Shower seat      Prior Function Level of Independence: Independent         Comments: Works as an Field seismologist Extremity Assessment Upper Extremity Assessment: Defer to OT evaluation    Lower Extremity Assessment Lower Extremity Assessment: Overall WFL for tasks assessed    Cervical / Trunk Assessment Cervical / Trunk Assessment: Other exceptions Cervical / Trunk Exceptions: s/p L4-5 lami  Communication   Communication: No difficulties  Cognition Arousal/Alertness: Awake/alert Behavior During Therapy: WFL for tasks assessed/performed Overall  Cognitive Status: Within Functional Limits for tasks assessed                                        General Comments      Exercises     Assessment/Plan    PT Assessment Patent does not need any further PT services  PT Problem List          PT Treatment Interventions      PT Goals (Current goals can be found in the Care Plan section)  Acute Rehab PT Goals Patient Stated Goal: return to doing yoga, resistive training PT Goal Formulation: All assessment and education complete, DC therapy    Frequency     Barriers to discharge        Co-evaluation               AM-PAC PT "6 Clicks" Mobility  Outcome Measure Help needed turning from your back to your side while in a flat bed without using bedrails?: None Help needed moving from lying on your back to sitting on the side of a flat bed without using bedrails?: None Help needed moving to and from a bed to a chair (including a wheelchair)?: None Help needed standing up from a chair using your arms (e.g., wheelchair or bedside chair)?: None Help needed to walk in hospital room?: None Help needed climbing 3-5 steps with a railing? : A Little 6 Click Score: 23    End of Session   Activity Tolerance: Patient tolerated treatment well Patient left: in bed;with call bell/phone within reach Nurse Communication: Mobility status PT Visit Diagnosis: Pain Pain - part of body:  (back)    Time: 3532-9924 PT Time Calculation (min) (ACUTE ONLY): 21 min   Charges:   PT Evaluation $PT Eval Low Complexity: 1 Low          Lillia Pauls, PT, DPT Acute Rehabilitation Services Pager [phone] Office [phone]   Norval Morton 06/20/2020, 9:19 AM

## 2020-06-20 NOTE — Discharge Summary (Signed)
Discharge Summary  Date of Admission: 06/19/2020  Date of Discharge: 06/20/20  Attending Physician: Autumn Patty, MD  Hospital Course: Patient was admitted following an uncomplicated L4-5 MIS laminectomy and synovial cyst resection. She was recovered in PACU and transferred to [facility]. Her hospital course was uncomplicated and the patient was discharged home on POD1. She will follow up in clinic with me in 2 weeks.  Neurologic exam at discharge:  AOx3, PERRL, EOMI, FS, TM Strength 5/5 x4, SILTx4  Discharge diagnosis: Lumbar synovial cyst  Jadene Pierini, MD 06/20/20 8:20 AM

## 2020-06-20 NOTE — Evaluation (Signed)
Occupational Therapy Evaluation Patient Details Name: Alexis Woods MRN: [id number] DOB: 12-Dec-1965 Today's Date: 06/20/2020    History of Present Illness Pt is a 54 y.o. 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   Clinical Impression   Patient evaluated by Occupational Therapy with no further acute OT needs identified. All education has been completed and the patient has no further questions. Pt demonstrates safe technique for ADLs and demonstrates good awareness of precautions.  See below for any follow-up Occupational Therapy or equipment needs. OT is signing off. Thank you for this referral.      Follow Up Recommendations  No OT follow up;Supervision - Intermittent    Equipment Recommendations  None recommended by OT    Recommendations for Other Services       Precautions / Restrictions Precautions Precautions: Back Precaution Booklet Issued: Yes (comment) Precaution Comments: verbally reviewed with pt. Restrictions Weight Bearing Restrictions: No      Mobility Bed Mobility Overal bed mobility: Modified Independent             General bed mobility comments: pt demonstrates safe technique    Transfers Overall transfer level: Independent Equipment used: None                  Balance Overall balance assessment: Mild deficits observed, not formally tested                                         ADL either performed or assessed with clinical judgement   ADL Overall ADL's : Needs assistance/impaired Eating/Feeding: Independent   Grooming: Wash/dry hands;Wash/dry face;Oral care;Brushing hair;Supervision/safety;Standing Grooming Details (indicate cue type and reason): reviewed safe technique for oral care to avoid bending Upper Body Bathing: Set up;Supervision/ safety;Sitting   Lower Body Bathing: Supervison/ safety;Sit to/from  stand Lower Body Bathing Details (indicate cue type and reason): able to perform figure 4 Upper Body Dressing : Set up;Sitting   Lower Body Dressing: Supervision/safety;Sit to/from stand Lower Body Dressing Details (indicate cue type and reason): able to perform figure 4 Toilet Transfer: Supervision/safety;Ambulation;Comfort height toilet;Grab bars   Toileting- Clothing Manipulation and Hygiene: Supervision/safety;Sit to/from stand Toileting - Clothing Manipulation Details (indicate cue type and reason): reviewed safe technique for accessing peri area     Functional mobility during ADLs: Supervision/safety       Vision Baseline Vision/History: Wears glasses Wears Glasses: At all times Patient Visual Report: No change from baseline       Perception     Praxis      Pertinent Vitals/Pain Pain Assessment: Faces Faces Pain Scale: Hurts a little bit Pain Location: back Pain Descriptors / Indicators: Operative site guarding Pain Intervention(s): Premedicated before session;Monitored during session     Hand Dominance     Extremity/Trunk Assessment Upper Extremity Assessment Upper Extremity Assessment: Defer to OT evaluation   Lower Extremity Assessment Lower Extremity Assessment: Defer to PT evaluation   Cervical / Trunk Assessment Cervical / Trunk Assessment: Other exceptions Cervical / Trunk Exceptions: s/p L4-5 lami   Communication Communication Communication: No difficulties   Cognition Arousal/Alertness: Awake/alert Behavior During Therapy: WFL for tasks assessed/performed Overall Cognitive Status: Within Functional Limits for tasks assessed  General Comments  reviewed use of acquisition and use reacher to retrieve items from floor; reviewed back precautions and safe technique for ADLs    Exercises     Shoulder Instructions      Home Living Family/patient expects to be discharged to:: Private  residence Living Arrangements: Spouse/significant other Available Help at Discharge: Family Type of Home: House Home Access: Stairs to enter Secretary/administrator of Steps: 5 Entrance Stairs-Rails: None Home Layout: Able to live on main level with bedroom/bathroom     Bathroom Shower/Tub: Producer, television/film/video: Standard     Home Equipment: Grab bars - tub/shower;Walker - 2 wheels;Crutches;Shower seat;Hand held shower head          Prior Functioning/Environment Level of Independence: Independent        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 Problem List: Decreased activity tolerance;Impaired balance (sitting and/or standing);Decreased knowledge of precautions;Pain      OT Treatment/Interventions:      OT Goals(Current goals can be found in the care plan section) Acute Rehab OT Goals Patient Stated Goal: return to doing yoga, resistive training OT Goal Formulation: All assessment and education complete, DC therapy  OT Frequency:     Barriers to D/C:            Co-evaluation              AM-PAC OT "6 Clicks" Daily Activity     Outcome Measure Help from another person eating meals?: None Help from another person taking care of personal grooming?: A Little Help from another person toileting, which includes using toliet, bedpan, or urinal?: A Little Help from another person bathing (including washing, rinsing, drying)?: A Little Help from another person to put on and taking off regular upper body clothing?: A Little Help from another person to put on and taking off regular lower body clothing?: A Little 6 Click Score: 19   End of Session Nurse Communication: Mobility status  Activity Tolerance: Patient tolerated treatment well Patient left: in bed (sitting EOB)  OT Visit Diagnosis: Pain Pain - part of body:  (back)                Time: 0911-1000 OT Time Calculation (min): 49 min Charges:  OT General Charges $OT Visit: 1 Visit OT  Evaluation $OT Eval Moderate Complexity: 1 Mod OT Treatments $Self Care/Home Management : 23-37 mins  Eber Jones., OTR/L Acute Rehabilitation Services Pager [phone] Office [phone]   Jeani Hawking M 06/20/2020, 11:20 AM

## 2020-06-25 LAB — SURGICAL PATHOLOGY

## 2020-07-02 ENCOUNTER — Telehealth: Payer: Self-pay | Admitting: Neurology

## 2020-07-02 NOTE — Telephone Encounter (Signed)
There was a message from, [place], [place] from [facility] Emergency Dept. is wanting to speak with Dr Vickey Huger about this pt. Please call back to speak with the ED secretary at [phone].

## 2020-07-02 NOTE — Telephone Encounter (Signed)
Daughter(on DPR) left voicemail stating pt may have had an over night seizure and would like a call to discuss.

## 2020-07-02 NOTE — Telephone Encounter (Signed)
Attempted to contact the daughter. There was no answer and her mail box is full.   ** If daughter returns call please make the daughter aware that Dr Vickey Huger has been in contact with the PA from [facility]. She is aware of the recent events. Please advise that once the patient is dc home to be in touch with [place] in case a follow up apt is necessary after dc

## 2020-07-02 NOTE — Telephone Encounter (Signed)
Patient described by ED-PA at [facility] as possibly in status.  Has not reached her mental baseline, staring off. Only answering closed questions and appearing confused. 2 witnessed seizures , one at home one in ED.   I asked the PA, Amber, to consider iv Keppra to load 500 mg and to transfer to [facility] hospital for neuro hospitalist service . I would like Dr Blenda Nicely to do an EEG on her.

## 2020-07-03 NOTE — Telephone Encounter (Signed)
Patient called and relayed she was in ER [facility] and currently still in hospital.  Patient's husband called  EMT and they admitted her  .  Patient is still on her seizure medication.   lamotrigine 100 mg twice daily and levetricacetam  500 mg twice daily . Patient wanted to make Dr.Dohmier aware . [phone] .   Patient had spine Surgery 06/19/2020 and has been on pain RX could this cause her seizures.

## 2020-07-03 NOTE — Telephone Encounter (Signed)
Pt left voicemail @ 1:16 today stating she is in [facility] because of 3 seizures she had, she asked that Dr Vickey Huger be made aware.  Pt states she had one with family, one when EMS came then one at the hospital.  Pt is asking to be called at either [phone] or [phone]

## 2020-07-03 NOTE — Telephone Encounter (Signed)
Called the patient back. Made her aware that Dr Vickey Huger was contacted by the PA there at the hospital. Advised the patient that Dr Dohmeier recommended to continue taking the medications that are prescribed. Dr Dohmeier did state that the pain medication can put patient's with sz disorder at a increased risk of having seizures. Pt made aware that Dr Vickey Huger recommended a continuous EEG monitor to be completed. They are unable to do that at current hospital and have been trying to get [facility] tx [facility]. Advised that if beds do not become available and she is dc home without having that testing completed, she should contact our office to let us know because Dr Vickey Huger may order that on a outpatient  Basis. Pt verbalized understanding.

## 2020-07-11 ENCOUNTER — Ambulatory Visit: Payer: 59 | Admitting: Neurology

## 2020-07-11 ENCOUNTER — Encounter: Payer: Self-pay | Admitting: Neurology

## 2020-07-11 ENCOUNTER — Telehealth: Payer: Self-pay | Admitting: Neurology

## 2020-07-11 VITALS — BP 102/71 | HR 77 | Ht 65.5 in | Wt 148.0 lb

## 2020-07-11 DIAGNOSIS — R412 Retrograde amnesia: Secondary | ICD-10-CM

## 2020-07-11 DIAGNOSIS — R561 Post traumatic seizures: Secondary | ICD-10-CM | POA: Diagnosis not present

## 2020-07-11 DIAGNOSIS — S0231XA Fracture of orbital floor, right side, initial encounter for closed fracture: Secondary | ICD-10-CM

## 2020-07-11 DIAGNOSIS — S069X2A Unspecified intracranial injury with loss of consciousness of 31 minutes to 59 minutes, initial encounter: Secondary | ICD-10-CM | POA: Diagnosis not present

## 2020-07-11 MED ORDER — LAMOTRIGINE 100 MG PO TABS: 100.0000 mg | ORAL_TABLET | Freq: Two times a day (BID) | ORAL | 3 refills | Status: DC

## 2020-07-11 MED ORDER — GABAPENTIN 100 MG PO CAPS: 100.0000 mg | ORAL_CAPSULE | Freq: Four times a day (QID) | ORAL | 5 refills | Status: DC

## 2020-07-11 MED ORDER — DONEPEZIL HCL 10 MG PO TABS: 10.0000 mg | ORAL_TABLET | Freq: Every day | ORAL | 5 refills | Status: DC

## 2020-07-11 MED ORDER — NAYZILAM 5 MG/0.1ML NA SOLN: NASAL | 2 refills | Status: DC

## 2020-07-11 NOTE — Telephone Encounter (Signed)
Pt. states she was recently in the hospital for a set of seizures. She asks if she should be seen sooner as she is still having headaches & involuntary jerks. Please advise.

## 2020-07-11 NOTE — Addendum Note (Signed)
Addended by: Melvyn Novas on: 07/11/2020 02:14 PM   Modules accepted: Orders

## 2020-07-11 NOTE — Progress Notes (Signed)
Provider:  Larey Seat, MD  Primary Care Physician:  Maris Berger, MD [place] [address]     Referring Provider: Maris Berger, [address] [place],  [place] [address]          Chief Complaint according to patient   Patient presents with:    . New Patient (Initial Visit)     pt with, rm 10. sun 8/29 she was at [place] where she fell and hit her head, + LOC and was admitted to hospital till 9/2 and dc home. Sat 9/4 11:30 pm sz in bed and had another one in ER at check in. she was dc on keppra at that time. prior to this never had a hx of sz      Other      06-13-2020-now returning after a cyst on her spine was discovered and she will undergo surgical resection with Dr. Zada Finders on Tuesday , 06-19-2020.        HISTORY OF PRESENT ILLNESS:  Alexis Woods is a 7. year old  Caucasian female patient seen here in a REVISIT on 07/11/2020 ,  The patient is here to follow up on muscle twitching in arms and legs at bedtime and when waking up,  She suspected Nocturnal seizures, and she has a diagnosis of post concussion syndrome.  The patient felt that Aricept has helped her memory and concentration, she had anterograde memory loss and word finding difficulties.  She feels this has improved and liked a refill for 6 month. Seizure medication need to be adjusted.  Her surgery with neurosurgeon Dr. Zada Finders has been succesfull and she had pain relief. No longer on narcotics.  Uses muscle relaxants at night.      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 Dr. Zada Finders plans a surgery soon. The patient had originally seen me in August or September of this year after she had a fall was in a [employer], that ended up in seizure activity.  She had a recurrent seizure a month later and was diagnosed with a traumatic brain injury and posttraumatic seizures.  The medications we have initiated are all by mouth, oral  and she will have to take her seizure medicines at midnight is a couple of sips of water before her surgery this way she does not violate the n.p.o. rule, and as soon as she is able to swallow safely after surgery I would also like her to take the next dose by mouth.  There is no need to switch her to an IV preparation and her surgery may be done with a spinal block which would allow her to take p.o. medicines much sooner than under general anesthesia.    Originally refereed in consultation from Dr. Salvadore Oxford, MD, at [facility]. 04-16-2020: We obtained an MRI of the brain: The MRI documented an orbital floor fracture but have not completely healed as orbital fat was protruding into the right maxillary sinus.  I think this is a good explanation for her current symptoms of facial right abnormal sensation the feeling of numbness the feeling of fullness and sinus pressure and drainage. A brain base fracture was also discovered, later as radiology revisit her images.   The patient also underwent an EEG study.  This was performed on 6 October and it was abnormal with focal slowing over the right temple which may  correlate well with the previously reported head injury facial and scalp swelling over that region.  The second abnormality were 2 bursts of generalized polyspike wave discharges at about 3.5 Hz none lasting longer than 4 seconds, apparently none lasting long enough to progress into a visible seizure activity the EKG remained in normal rhythm throughout these events so this was an electrographic seizure but not a clinical seizure.  Usually we see this as a GMA I reviewed juvenile myoclonic epilepsy onset I am also very concerned that there was a generalized onset and I would have expected a focal onset if it would be related to the accident.  She reports today that she has ZONE-OUT spells , she seems to be somewhere else, absent and the spells most likely to occur when fatigued.  Absence seizure -  she has had these for 50 years- and some mornings she will drop an object or move with an isolated twitch.    .  Chief concern according to patient :   Alexis Woods went to a store in her usual good health on Sunday, 29 August and she has no memory but occurred next but she was witnessed to have fallen and hit her head.  A smart phone picture that her husband took shows that her right face is very bruised and a braised and she also reports that she had not all over her scalp the right face and right scalp were very tender.  She was admitted to the hospital and stayed there until 2 September then discharged home. She had broken her nose.  She remained with  retrograde amnesia patchy memory and difficulties retaining new memory.  Her on Friday following she had projectile vomiting at home only and and by Saturday, 4 September at about 11:30 PM she suffered a seizure in bed which was witnessed by her husband. Returning to the Emergency room.   She was returning to the emergency room where she had another witnessed seizure- this time she was started on Keppra.  Prior to her head injury she never had suffered a seizure.  There is no family history of epilepsy.  She has no previous  history of any traumatic brain injuries. She recently has had a UTI, and she has been drinking mushroom coffee. She was sleep derived for planning a trip.     I have the pleasure of seeing Alexis Woods today, a right -handed White or Caucasian female with a possible sleep disorder.  She has suffered a TBI-   has a past medical history of Seizure ([place]).     Social history:  Patient is working as a radio show host ,  The patient currently works par time.  Tobacco use: none.  ETOH use; 1/month, Caffeine intake in form of Coffee( now quit ) Regular exercise in form of running, burpees..   Hobbies : radio, robotics.        Review of Systems: Out of a complete 14 system review, the patient complains of only the following symptoms,  and all other reviewed systems are negative.:   retrograde amnesia,  headaches posttraumatic, skull and dental pain- facial pain, nasal fracture. Orbital bottom fracture.    Social History   Socioeconomic History  . Marital status: Married    Spouse name: Not on file  . Number of children: Not on file  . Years of education: Not on file  . Highest education level: Not on file  Occupational History  . Not on file  Tobacco Use  . Smoking status: Never Smoker  . Smokeless tobacco: Never Used  Vaping Use  . Vaping Use: Never used  Substance and Sexual Activity  . Alcohol use: Not Currently  . Drug use: Not Currently  . Sexual activity: Not on file  Other Topics Concern  . Not on file  Social History Narrative  . Not on file   Social Determinants of Health   Financial Resource Strain: Not on file  Food Insecurity: Not on file  Transportation Needs: Not on file  Physical Activity: Not on file  Stress: Not on file  Social Connections: Not on file    No family history on file.  Past Medical History:  Diagnosis Date  . Seizure [facility])     Past Surgical History:  Procedure Laterality Date  . ABDOMINAL HYSTERECTOMY    . CLEFT LIP REPAIR  1987  . LUMBAR LAMINECTOMY/ DECOMPRESSION WITH MET-RX Right 06/19/2020   Procedure: Right Lumbar four-five Minimally invasive decompression and synovial cyst resection;  Surgeon: Judith Part, MD;  Location: [facility];  Service: Neurosurgery;  Laterality: Right;     Current Outpatient Medications on File Prior to Visit  Medication Sig Dispense Refill  . bacitracin-polymyxin b (POLYSPORIN) ointment Apply 1 application topically daily as needed (wound care).    . donepezil (ARICEPT) 5 MG tablet TAKE 1 TABLET(5 MG) BY MOUTH AT BEDTIME (Patient taking differently: Take 5 mg by mouth at bedtime.) 30 tablet 2  . gabapentin (NEURONTIN) 100 MG capsule Take 1 capsule (100 mg total) by mouth 2 (two) times daily. (Patient taking differently: Take  100 mg by mouth 4 (four) times daily.) 60 capsule 5  . Homeopathic Products (ARNICA [place] PO) Take 1 tablet by mouth 3 (three) times daily as needed (pain).    . Homeopathic Products (ARNICARE ARNICA) CREA Apply 1 application topically daily as needed (bruising).    . hydrocortisone cream 1 % Apply 1 application topically 2 (two) times daily as needed (rash).    Marland Kitchen lamoTRIgine (LAMICTAL) 100 MG tablet Take 1 tablet (100 mg total) by mouth 2 (two) times daily. 180 tablet 3  . levETIRAcetam (KEPPRA) 500 MG tablet Take 1 tablet (500 mg total) by mouth 2 (two) times daily. (Patient taking differently: Take 1,000 mg by mouth 2 (two) times daily.) 180 tablet 3  . Multiple Vitamin (MULTIVITAMIN) tablet Take 1 tablet by mouth daily.    Ernestine Conrad 3-6-9 Fatty Acids (OMEGA-3-6-9 PO) Take 1 capsule by mouth daily.    Marland Kitchen OVER THE COUNTER MEDICATION Take 750 mg by mouth daily. Curamed.    . sodium chloride (OCEAN) 0.65 % SOLN nasal spray Place 1 spray into both nostrils as needed for congestion.     No current facility-administered medications on file prior to visit.    Allergies  Allergen Reactions  . Codeine Nausea And Vomiting  . Oxycodone     Altered mental state  . Sertraline Nausea And Vomiting  . Gluten Meal Swelling and Rash    Top lip swells      Ref Range & Units 11 mo ago  Total Protein 6.0 - 8.5 g/dL 7.2   Albumin 3.8 - 4.9 g/dL 4.8   Bilirubin Total 0.0 - 1.2 mg/dL 0.4   Bilirubin, Direct 0.00 - 0.40 mg/dL 0.10   Alkaline Phosphatase 39 - 117 IU/L 86   AST 0 - 40 IU/L 35   ALT 0 - 32 IU/L 30       Physical exam:  There  were no vitals filed for this visit. There is no height or weight on file to calculate BMI.   Wt Readings from Last 3 Encounters:  06/19/20 142 lb 3.2 oz (64.5 kg)  06/18/20 142 lb 4.8 oz (64.5 kg)  06/13/20 144 lb (65.3 kg)     Ht Readings from Last 3 Encounters:  06/19/20 5' 5.5" (1.664 m)  06/18/20 5' 5.5" (1.664 m)  06/13/20 5' 5.5" (1.664 m)       General: The patient is awake, alert and appears not in acute distress. The patient is well groomed. Head: Normocephalic, but traumatic. Clef lip -  Neck is supple. Mallampati ,  Cardiovascular:  Regular rate and cardiac rhythm by pulse,  without distended neck veins. Respiratory: Lungs are clear to auscultation.  Skin:  Without evidence of ankle edema, or rash. Trunk: The patient's posture is erect.   Neurologic exam : The patient is awake and alert, oriented to place and time.   Memory subjective described as intact.  Attention span & concentration ability appears normal.  Speech is fluent,  without  dysarthria, dysphonia or aphasia.  Mood and affect are appropriate.   Cranial nerves: no loss of smell or taste reported  Pupils are equal and briskly reactive to light. Funduscopic exam revealed no oedema or palor or bleed. Marland Kitchen  Extraocular movements in vertical and horizontal planes were intact and without nystagmus. No Diplopia. Visual fields by finger perimetry are intact. Hearing was intact to soft voice and finger rubbing.    Facial sensation reduced to fine touch though out her right face- right scalp, right orbit. .  Facial motor strength is symmetric and tongue and uvula move midline.  Neck ROM : rotation, tilt and flexion extension were normal for age and shoulder shrug was symmetrical.    Motor exam:  Symmetric bulk, tone and ROM.   Normal tone without cog wheeling, symmetric grip strength .   Sensory:  Fine touch, pinprick and vibration were tested  and  normal.  Proprioception tested in the upper extremities was normal.   Coordination: Rapid alternating movements in the fingers/hands were of normal speed.  The Finger-to-nose maneuver was intact without evidence of ataxia, dysmetria or tremor.   Gait and station: Patient could rise unassisted from a seated position, walked without assistive device.  Stance is of normal width/ base and the patient turned with 3 steps.  Toe  and heel walk were deferred.  Deep tendon reflexes: in the  upper and lower extremities are symmetric and intact. Patella was very brisk.  Babinski response was deferred .        After spending a total time of  45  minutes face to face -mostly time for physical and neurologic examination, and additional 15- 20 minutes-  review of laboratory studies,  personal review of imaging studies, reports and results of other testing and review of referral information / records as far as provided in visit, I have established the following assessments:  1) TBI with vomiting , LOC and post concussion , subacute phase. 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   2) The patient has the classic post traumatic symptoms of amnestic spells,  memory lapses for at least a couple of minutes before she ever entered the store in which she finally fell, seized, and hit her head,   She has had some short-term memory difficulty since but they are improving.  She has had 2 seizures  post traumatic. She continues to have posttraumatic headaches and MRI indicated a right orbital floor fracture with orbital fat protruding  scalp tenderness and looking at the documented bony injuries -I am very happy to see that her ocular movements are intact there is no nystagmus, and no evidence of retro-orbital bleed. Marland Kitchen   3)  lumbar cyst is not a cause or sequelae of her falls and seizures.     My Plan is to proceed with:  Keppra to be continued - now on 1000 mg bid. I will ask her to take both seizure medication- Lamictal 100 mg and Keppra 1000 mg at midnight - this would be taken with 2 sips of water , this is an extra dose- the surgery, start regimen again as soon as y ou are able to  swallow safely.  We had thought we may d/c keppra after she reaches 100 mg bid on lamictal.  She reports another seizure  with initial cry and followed by a tongue bite and finally a  postictal "absence". Her eu yes was rolling back and her daughter noted a seizure of about 40 seconds. Witnessed by ED staff, she had mislaid her keppra and skipped 2 doses.  This way I feel not inclined to take her off Keppra- and I will refer to Dr. Rosina Lowenstein for further work up.  Gabapentin has been used for twitching and pain in hands and feet- will not take her off , given how well it had worked for this indication.    The patient can return to work with 6 month driving restriction- and restrictions to operate machinery.  I had started her on aricept which has anecdotal evidence of improving memory in TBI patient.  She feels iy helps and will give it for another 4-6 month. I increased to 10 mg daily. She feels it had a significant benefit.  Facial and dental pain to be treated with low dose gabapentin, 100 mg up to 4 a day.- she will continue.   I would like to thank Dr. Zada Finders- and her PCP    Maris Berger, [address],  [place] [address] for allowing me to meet with and to take care of this pleasant patient.   In short, Alexis Woods is presenting with a concussion and abnormal MRI and EEG- Dr Rosina Lowenstein for EMU referral.    I plan to follow up either personally or through our NP within 3-4  month.   CC: I will share my notes with PCP   Electronically signed by: Larey Seat, MD 07/11/2020 1:33 PM  [facility] Board certified by The [employer] of Sleep Medicine and Diplomate of the [employer] of Sleep Medicine. Board certified In Neurology through the [employer], Fellow of the [employer] of Neurology. Medical Director of [facility].

## 2020-07-11 NOTE — Patient Instructions (Signed)

## 2020-07-11 NOTE — Telephone Encounter (Signed)
I called patient.  I offered her an appointment today at 130 with Dr. Vickey Huger.  Patient accepted his appointment and understands to arrive 30 minutes prior to this appointment.

## 2020-07-12 ENCOUNTER — Other Ambulatory Visit: Payer: Self-pay | Admitting: Neurology

## 2020-07-12 MED ORDER — NAYZILAM 5 MG/0.1ML NA SOLN: NASAL | 2 refills | Status: DC

## 2020-07-12 NOTE — Telephone Encounter (Signed)
I spoke to [place] at [facility] who said the pharmacy can order this medication. However, they do not have the prescription and have requested it to be resent.  We will send this request to Dr. Lucia Gaskins for approval (Dr. Vickey Huger not is office at this time).  I also called the patient to provide her with this update.

## 2020-07-12 NOTE — Telephone Encounter (Signed)
Pt called, [facility] do not have Midazolam (NAYZILAM) 5 MG/0.1ML SOLN in stock. I have called around to other [facility] and no one has that medication. Would like a call from the nurse to help me.

## 2020-07-12 NOTE — Progress Notes (Signed)
Elevated creatinine in the setting of normal BUN needs to be followed q 6 month. I recommend a Cystatin C test to get a more accurate picture of the renal function, independent of muscle mass.  Liver function is normal.

## 2020-07-12 NOTE — Progress Notes (Signed)
Expected lamotrigine level for dose and Keppra level is still pending.

## 2020-07-13 ENCOUNTER — Other Ambulatory Visit: Payer: Self-pay | Admitting: Neurology

## 2020-07-14 LAB — COMPREHENSIVE METABOLIC PANEL
ALT: 13 IU/L (ref 0–32)
AST: 16 IU/L (ref 0–40)
Albumin/Globulin Ratio: 1.8 (ref 1.2–2.2)
Albumin: 4.5 g/dL (ref 3.8–4.9)
Alkaline Phosphatase: 98 IU/L (ref 44–121)
BUN/Creatinine Ratio: 12 (ref 9–23)
BUN: 14 mg/dL (ref 6–24)
Bilirubin Total: 0.3 mg/dL (ref 0.0–1.2)
CO2: 27 mmol/L (ref 20–29)
Calcium: 9.8 mg/dL (ref 8.7–10.2)
Chloride: 101 mmol/L (ref 96–106)
Creatinine, Ser: 1.21 mg/dL — ABNORMAL HIGH (ref 0.57–1.00)
GFR calc Af Amer: 59 mL/min/{1.73_m2} — ABNORMAL LOW (ref >59)
GFR calc non Af Amer: 51 mL/min/{1.73_m2} — ABNORMAL LOW (ref >59)
Globulin, Total: 2.5 g/dL (ref 1.5–4.5)
Glucose: 73 mg/dL (ref 65–99)
Potassium: 4.8 mmol/L (ref 3.5–5.2)
Sodium: 141 mmol/L (ref 134–144)
Total Protein: 7 g/dL (ref 6.0–8.5)

## 2020-07-14 LAB — LEVETIRACETAM LEVEL: Levetiracetam Lvl: 37.9 ug/mL (ref 10.0–40.0)

## 2020-07-14 LAB — LAMOTRIGINE LEVEL: Lamotrigine Lvl: 6.3 ug/mL (ref 2.0–20.0)

## 2020-07-17 NOTE — Progress Notes (Signed)
Keppra and Lamictal dose in therapeutic range.

## 2020-07-18 ENCOUNTER — Telehealth: Payer: Self-pay | Admitting: Neurology

## 2020-07-18 NOTE — Telephone Encounter (Signed)
Called the patient back and she was wanting to check on the status about getting in with Dr Melynda Ripple for the continuous EEG monitoring. Advised that I would have our referral team check on the status and if it will continue to be a long wait, I will see if Dr Vickey Huger would like to use [facility]. Pt will wait to hear back either from [name] or myself on which route we will move forward with.

## 2020-07-18 NOTE — Telephone Encounter (Signed)
Pt is asking for a call om RN to discuss a study for pt in which she states she was to be in the hospital for, please call.

## 2020-07-18 NOTE — Telephone Encounter (Signed)
Due to Dr. Melynda Ripple appointments on hold per [facility] in referrals, Dr. Vickey Huger did agree to send a referral to [facility] to have a 3-day EEG completed for the patient.  I have submitted all information needed and faxed the form to the number provided.  Received confirmation of fax.

## 2020-07-18 NOTE — Telephone Encounter (Signed)
-----   Message from Melvyn Novas, MD sent at 07/12/2020  8:48 AM EST ----- Elevated creatinine in the setting of normal BUN needs to be followed q 6 month. I recommend a Cystatin C test to get a more accurate picture of the renal function, independent of muscle mass.  Liver function is normal.

## 2020-07-18 NOTE — Telephone Encounter (Signed)
Called the patient and reviewed her lab results with her. Informed her to make sure that she is hydrating well. Pt verbalized understanding. Advised the keppra and lamictal levels were in correct range.  Pt had no questions at this time but was encouraged to call back if questions arise.

## 2020-07-19 ENCOUNTER — Ambulatory Visit: Payer: 59 | Admitting: Neurology

## 2020-07-19 NOTE — Telephone Encounter (Signed)
Called the patient and advised her of the plan moving forward. She verbalized understanding

## 2020-07-23 NOTE — Telephone Encounter (Signed)
Pt is asking for a call to discuss her results in greater detail.

## 2020-07-23 NOTE — Telephone Encounter (Signed)
Called the pt back and advised her once more on the labs.  Patient was concerned with the creatinine level being elevated.  Informed her a lot of times this can indicate needing to hydrate with water.  Patient indicates that she drinks plenty of water and questioned if she could add Pedialyte into her daily routine.  Advised her potassium levels were in good range and that would be okay.  Patient was appreciative for the call back and had no further questions.

## 2020-07-27 ENCOUNTER — Telehealth: Payer: Self-pay | Admitting: Neurology

## 2020-07-27 NOTE — Telephone Encounter (Signed)
Patient letting us know that she is going to PCP to get evaluated for possible ruptured ear drum. -VRP

## 2020-07-27 NOTE — Telephone Encounter (Signed)
Pt. states she had extremely excruciating pain in ear. It then turned into a bad headache with a lot of pressure. She states her ear bled some & relieved the pressure to deal with it a little better, but she still is experiencing like a normal ear ache. She states she is trying to be seen by her PCP today, but wanted to let Dr. Marland Kitchen RN know what's going on.

## 2020-07-30 NOTE — Telephone Encounter (Signed)
Thank you for FYI

## 2020-08-01 NOTE — Telephone Encounter (Signed)
Pt. states she is trying to be seen with an ENT doctor. She states ear drum pain is getting worse & worse. She states the pain level is 10 out of 10. Pt. states the pain is going from her cheek bone, ear, to side of head. She states she is not sleeping or eating & is asking for something to relieve the pain and/or to be seen asap. Please advise.

## 2020-08-01 NOTE — Telephone Encounter (Signed)
Called the patient back.  Reviewed her concerns.  Patient states she started out with having a huge headache.  And then her ear began to bleed which relieved pressure and felt better.  Patient is concerned something else could be going on such as an aneurysm.  My recommendation was going to be the patient needs to go to urgent care or primary care to be evaluated.  But she did indicate she is currently at the ENT's office seeing a PA. They were able to evaluate her ear drum and have provided her with eardrops to help.  Given someone has been able to be exam her and determined there is a ear drum rupture and she is not having any other neurological symptoms, I do not feel that there is any concern for an aneurysm at this time.  Patient is going to also be seen by the ENT next Friday as that is the soonest they can see her.  I did confirm that at least a PA has laid eyes on her.  Patient is just concerned for the pain that she is having.  Advised that since this is a ENT issue she should discuss pain medication/relief with them.  Advised if they do not offer pain medication or something to provide relief the next step would be is contacting primary care office.  Informed from a neurological standpoint we would not want to order anything that could mask any neuro changes.  Advised that hopefully with the eardrops she may start finding relief.  Patient verbalized understanding.  ** pt will be completing the EEG studies coming up at home. She wanted to know if Dr Vickey Huger was wanting her to come off of her seizure medication for the studies. Informed her I would question with Dr Vickey Huger on what she recommends and call her back.

## 2020-08-01 NOTE — Telephone Encounter (Signed)
Due to the patient being home Dr Vickey Huger doesn't recommend the patient discontinuing sz medications. I will inform the patient.

## 2020-08-02 NOTE — Telephone Encounter (Signed)
Called the patient and Advised that Dr. Vickey Huger would like to make sure the patient stays on her medication while completing the EEGs at home.  Patient verbalized understanding

## 2020-08-07 ENCOUNTER — Encounter: Payer: Self-pay | Admitting: Neurology

## 2020-08-08 ENCOUNTER — Encounter: Payer: Self-pay | Admitting: Neurology

## 2020-08-21 NOTE — Telephone Encounter (Signed)
Late entry: informed pt of appointment times per request.

## 2020-08-28 ENCOUNTER — Telehealth: Payer: Self-pay | Admitting: Neurology

## 2020-08-28 MED ORDER — LAMOTRIGINE 100 MG PO TABS
150.0000 mg | ORAL_TABLET | Freq: Two times a day (BID) | ORAL | 3 refills | Status: DC
Start: 2020-08-28 — End: 2020-08-28

## 2020-08-28 MED ORDER — LAMOTRIGINE 150 MG PO TABS: 150.0000 mg | ORAL_TABLET | Freq: Two times a day (BID) | ORAL | 2 refills | Status: DC

## 2020-08-28 NOTE — Addendum Note (Signed)
Addended by: Marlana Latus C on: 08/28/2020 05:00 PM   Modules accepted: Orders

## 2020-08-28 NOTE — Telephone Encounter (Signed)
Received the prolonged EEG report from [facility]. I will send to Dr Vickey Huger to review the results and will contact the patient with the findings based off the report/result.

## 2020-08-28 NOTE — Telephone Encounter (Signed)
Dr Dohmeier states given the seizure activity that was present on the study, the best medication would be to increase the lamictal to 150 mg BID. Once she makes this increase then we could discuss weaning off Keppra. Dr Dohmeier recomends once she starts the new dose of lamictal she can decrease keppra to 750 mg BID for 1 month. If not seizure like activity then can decrease the keppra to 500 mg BID. Will schedule a follow up visit with the patient in 2 months to allow this transition on the medication.

## 2020-08-28 NOTE — Telephone Encounter (Signed)
Called the pt and advised her of the results of the EEG.  I instructed her on the medication treatment plan.  Patient wanted to know about surgery and if that was an option.  She states that she does not want to be on the medication if there are other alternatives.  Informed I would question that with Dr. Vickey Huger.  She asked if the lamotrigine comes in a 150 mg tablet.  Advised it appears it does, I will resend the prescription so that it allows her to just do 1 tablet twice a day instead of cutting it in half.  Patient preferred that.  I have scheduled a follow-up visit on April 7 to allow the patient time to decrease the Keppra and see how well she tolerates that along with a month worth of increasing the Lamictal.  Patient verbalized understanding

## 2020-08-28 NOTE — Addendum Note (Signed)
Addended by: Melvyn Novas on: 08/28/2020 04:18 PM   Modules accepted: Orders

## 2020-08-28 NOTE — Telephone Encounter (Signed)
EEG was positive !  There were multiple epileptiform discharges noted, poly-spike wave with 4 herz frequency. 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

## 2020-08-28 NOTE — Telephone Encounter (Signed)
lamictal ( already on ) and depakote ( will avoid this drug:  it causes weight gain, hairloss and tremors - not side effects people like ) . These  are the classic medications for this type of seizures.    We should reduce  increase lamictal to 150 mg bid po.

## 2020-08-29 ENCOUNTER — Encounter: Payer: Self-pay | Admitting: Neurology

## 2020-09-10 ENCOUNTER — Other Ambulatory Visit: Payer: Self-pay | Admitting: Neurology

## 2020-09-10 MED ORDER — LEVETIRACETAM 1000 MG PO TABS
1000.0000 mg | ORAL_TABLET | Freq: Two times a day (BID) | ORAL | 1 refills | Status: DC
Start: 2020-09-10 — End: 2020-10-04

## 2020-09-26 ENCOUNTER — Other Ambulatory Visit: Payer: Self-pay | Admitting: Neurology

## 2020-09-26 ENCOUNTER — Telehealth: Payer: Self-pay | Admitting: Neurology

## 2020-09-26 ENCOUNTER — Encounter: Payer: Self-pay | Admitting: Neurology

## 2020-09-26 NOTE — Telephone Encounter (Signed)
In regards to patient's previous mychart message Dr Vickey Huger has prepared a letter for the patient. Letter was placed in the mail. Wanted to have it documented that the patient has been told if she further self medicates Dr Vickey Huger will no longer provide her care.  Dear Alexis Woods   We had several visits in the [facility] office where we have discussed the work-up for your spells ,which were then identified as seizures.   These were generalized seizures -they occurred in both hemispheres of the brain at the same time and for this reason Lamictal was chosen as a potent antiepileptic medication.   You have been able to titrate to the recommended level and have remained at goal. In the meantime you had voiced concerns about being on too much medication or being overmedicated and I have therefore offered to reduce Keppra once you reach Lamictal's therapeutic levels.   Your reaction did surprise me as you voiced concerns about experimental use of medication, and accused me of playing with your medications.  You now have informed my nurse that you actually increased the dose of Keppra , which was not discussed and is not endorsed by myself or any of my colleagues.   I am very sorry, but I cannot remain your treating neurologist if you self medicate and self dose against my medical advice. I understood you to want to be conservative in dosing regimen and to avoid polypharmacy.   There are side effects that are dose dependent with many medications including antiepileptic medications.   If you do not intend to follow my advice, I would strongly recommend that you change your care to another neurologist.   Please inform me if you have plans to stay at [facility] or to switch before the scheduled visit on 10-04-2020.   Respectfully ,   Dr. Melvyn Novas , MD

## 2020-10-04 ENCOUNTER — Other Ambulatory Visit: Payer: Self-pay | Admitting: Neurology

## 2020-10-04 ENCOUNTER — Ambulatory Visit: Payer: 59 | Admitting: Neurology

## 2020-10-04 ENCOUNTER — Encounter: Payer: Self-pay | Admitting: Neurology

## 2020-10-04 ENCOUNTER — Other Ambulatory Visit: Payer: Self-pay

## 2020-10-04 VITALS — BP 106/64 | HR 58 | Ht 66.0 in | Wt 153.3 lb

## 2020-10-04 DIAGNOSIS — Z5181 Encounter for therapeutic drug level monitoring: Secondary | ICD-10-CM

## 2020-10-04 DIAGNOSIS — R412 Retrograde amnesia: Secondary | ICD-10-CM | POA: Diagnosis not present

## 2020-10-04 DIAGNOSIS — S0231XA Fracture of orbital floor, right side, initial encounter for closed fracture: Secondary | ICD-10-CM

## 2020-10-04 DIAGNOSIS — S069X2S Unspecified intracranial injury with loss of consciousness of 31 minutes to 59 minutes, sequela: Secondary | ICD-10-CM

## 2020-10-04 DIAGNOSIS — R9401 Abnormal electroencephalogram [EEG]: Secondary | ICD-10-CM

## 2020-10-04 DIAGNOSIS — G40309 Generalized idiopathic epilepsy and epileptic syndromes, not intractable, without status epilepticus: Secondary | ICD-10-CM | POA: Diagnosis not present

## 2020-10-04 MED ORDER — LEVETIRACETAM 500 MG PO TABS: ORAL_TABLET | ORAL | 5 refills | Status: DC

## 2020-10-04 MED ORDER — LAMOTRIGINE 200 MG PO TABS: 200.0000 mg | ORAL_TABLET | Freq: Two times a day (BID) | ORAL | 0 refills | Status: DC

## 2020-10-04 MED ORDER — GABAPENTIN 100 MG PO CAPS: ORAL_CAPSULE | ORAL | 5 refills | Status: DC
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
## 2020-10-09 NOTE — Telephone Encounter (Signed)
levETIRAcetam (KEPPRA) 500 MG tablet  5 ordered        Summary: 1.5 tab bid po. 750 mg bid po., Print   Start: 10/04/2020  Ord/Sold: 10/04/2020 (O)   Report  Taking:   Long-term:    Pharmacy: [facility] [phone] - [place], [place] - [address] AT [address]  Med Dose History  ChangeDiscontinue     Patient Sig: 1.5 tab bid po.  750 mg bid po.     Ordered on: 10/04/2020     Authorized by: Melvyn Novas     Dispense: 90 tablet     Admin Instructions: 1.5 tab bid po.  750 mg bid po.     This was ordered at your visit : Keppra 750 mg in form of 1 and 1/2 of 500 mg tab.    I am happy to correct all medical history / social history input as requested. I will certainly do this over easter holidays. Please allow me that time.   I am not doing anything to deceive or portrait you in a bed light, but I stand by my observations of your personality having changed, you are seeing yourself as more assertive, but I noted some impulsivity, too.   Melvyn Novas, MD

## 2020-10-15 ENCOUNTER — Other Ambulatory Visit: Payer: Self-pay | Admitting: Neurology

## 2020-10-15 MED ORDER — LEVETIRACETAM 500 MG PO TABS: ORAL_TABLET | ORAL | 5 refills | Status: DC

## 2020-10-15 MED ORDER — LEVETIRACETAM 750 MG PO TABS: 750.0000 mg | ORAL_TABLET | Freq: Two times a day (BID) | ORAL | 5 refills | Status: DC

## 2020-10-16 ENCOUNTER — Encounter: Payer: Self-pay | Admitting: Psychology

## 2020-11-19 ENCOUNTER — Encounter: Payer: Self-pay | Admitting: Neurology

## 2020-11-19 ENCOUNTER — Other Ambulatory Visit: Payer: Self-pay | Admitting: Neurology

## 2020-11-19 MED ORDER — LAMOTRIGINE 200 MG PO TABS: 200.0000 mg | ORAL_TABLET | Freq: Two times a day (BID) | ORAL | 1 refills | Status: AC

## 2020-11-20 ENCOUNTER — Encounter: Payer: 59 | Admitting: Psychology

## 2020-11-22 ENCOUNTER — Ambulatory Visit: Payer: 59 | Admitting: Adult Health

## 2020-12-04 ENCOUNTER — Other Ambulatory Visit: Payer: Self-pay

## 2020-12-04 ENCOUNTER — Emergency Department (HOSPITAL_COMMUNITY)
Admission: EM | Admit: 2020-12-04 | Discharge: 2020-12-05 | Disposition: A | Discharge status: Home / Self Care | Payer: 59 | Attending: Emergency Medicine | Admitting: Emergency Medicine

## 2020-12-04 ENCOUNTER — Encounter (HOSPITAL_COMMUNITY): Payer: Self-pay | Admitting: Emergency Medicine

## 2020-12-04 DIAGNOSIS — F05 Delirium due to known physiological condition: Secondary | ICD-10-CM

## 2020-12-04 DIAGNOSIS — R569 Unspecified convulsions: Secondary | ICD-10-CM

## 2020-12-04 LAB — CBG MONITORING, ED: Glucose-Capillary: 79 mg/dL (ref 70–99)

## 2020-12-04 MED ORDER — LORAZEPAM 2 MG/ML IJ SOLN
1.0000 mg | Freq: Once | INTRAMUSCULAR | Status: AC
  Administered 2020-12-05: 1 mg via INTRAVENOUS
  Filled 2020-12-04: qty 1
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
## 2020-12-05 ENCOUNTER — Telehealth: Payer: Self-pay | Admitting: Neurology

## 2020-12-05 NOTE — Telephone Encounter (Signed)
Noted. The ER stated the patient/daughter confirmed patient has been taking her medication and they even showed the provider the pill organizer she uses.

## 2020-12-05 NOTE — Telephone Encounter (Signed)
I returned the patient's call and LVM asking for call back.

## 2020-12-05 NOTE — Telephone Encounter (Signed)
Pt is asking for a call from RN to discuss the need for an increase her seizure medication to ensure she doesn't have another seizure.

## 2020-12-05 NOTE — Telephone Encounter (Signed)
Late entry: I received an after-hours phone message from patient's daughter [name] on 12/04/2020 at 8:41 PM.  I was able to call patient's daughter right back.  She reported that patient had a seizure at home.  She was entering the home where patient and her husband reside. Patient had been given intranasal midazolam, she was not fully responsive and was making grunting noises.  I advised patient's daughter to call EMS and to have her taken to the ER.  She was also advised to make sure that patient was on her side and supervised. She was not actively seizing at the time.  Per daughter, patient had a seizure prior to daughter's arrival.  I reviewed her chart, patient was observed overnight in the ED.   Dr. Vickey Huger, please review and make additional recommendations as deemed necessary.

## 2020-12-06 ENCOUNTER — Ambulatory Visit: Payer: 59 | Admitting: Psychology

## 2020-12-06 ENCOUNTER — Other Ambulatory Visit: Payer: Self-pay | Admitting: *Deleted

## 2020-12-06 ENCOUNTER — Encounter: Payer: Self-pay | Admitting: Neurology

## 2020-12-06 ENCOUNTER — Encounter: Payer: Self-pay | Admitting: Adult Health

## 2020-12-06 DIAGNOSIS — Z5181 Encounter for therapeutic drug level monitoring: Secondary | ICD-10-CM

## 2020-12-06 NOTE — Telephone Encounter (Signed)
I called the patient on [phone] and LVM (ok per DPR) advising patient that Dr Vickey Huger would like to draw a Lamotrigine level and has advised that she come in the morning to have the lab drawn just prior to her morning dose. I advised the patient that the office is open tomorrow from 8-12 and then again next week starting Monday.   Lamotrigine level order placed for clinic collect.

## 2020-12-06 NOTE — Telephone Encounter (Signed)
Spoke with pt's daughter Alexis Woods (on [place]) and discussed the conversation with patient and the plan from Dr Dohmeier to check a Lamotrigine level just prior to morning dose. The daughter scheduled pt's lab appt for tomorrow morning at 10:00 AM (patient takes AM dose at 10:30) and she is aware the lab will be here at the office. We will f/u with results when they are available. [name] verbalized appreciation and understanding. She is also aware that we have been unable to reach the patient and she confirmed the pt's number on chart is correct. She will contact the patient.

## 2020-12-06 NOTE — Telephone Encounter (Signed)
Spoke with Dr Vickey Huger. She has advised at this time to have patient come to office for a lab draw to check Lamotrigine level. I have placed the order. Tried to call patient back but it went straight to VM. I will try to reach her again. Also will send mychart message.

## 2020-12-06 NOTE — Telephone Encounter (Signed)
This is being addressed in another phone encounter. I haven with both patient and daughter. Our office did call the patient back on June 8th and LVM asking for call back.

## 2020-12-06 NOTE — Telephone Encounter (Signed)
The patient returned my call. She states she has not missed any doses of her seizure medications. She has not had any issues with missed sleep or stress. She takes her medications at 10:30 AM and 10:30 PM everyday. Pt is on keppra 750 mg BID, Lamotrigine 200 mg BID, Gabapentin 200 mg TID. The pt's last seizure prior to 12/04/20 was in January 2022 and September 2021. Patient would like a call back with next steps. Her next appointment currently is scheduled with [facility] NP for September 2022.

## 2020-12-06 NOTE — Telephone Encounter (Signed)
This is being addressed in other encounter.

## 2020-12-07 ENCOUNTER — Other Ambulatory Visit (INDEPENDENT_AMBULATORY_CARE_PROVIDER_SITE_OTHER): Payer: 59

## 2020-12-07 DIAGNOSIS — Z0289 Encounter for other administrative examinations: Secondary | ICD-10-CM

## 2020-12-07 DIAGNOSIS — Z5181 Encounter for therapeutic drug level monitoring: Secondary | ICD-10-CM

## 2020-12-10 ENCOUNTER — Encounter: Payer: Self-pay | Admitting: Neurology

## 2020-12-10 MED ORDER — NAYZILAM 5 MG/0.1ML NA SOLN: NASAL | 2 refills | Status: AC

## 2020-12-10 NOTE — Telephone Encounter (Signed)
Chart states the Lamotrogine level was collected on 6/10 at 9:59 AM. Result is pending.

## 2020-12-11 LAB — LAMOTRIGINE LEVEL: Lamotrigine Lvl: 10.9 ug/mL (ref 2.0–20.0)

## 2020-12-18 ENCOUNTER — Encounter: Payer: Self-pay | Admitting: Neurology

## 2020-12-18 ENCOUNTER — Other Ambulatory Visit: Payer: Self-pay | Admitting: *Deleted

## 2020-12-18 MED ORDER — GABAPENTIN 100 MG PO CAPS: ORAL_CAPSULE | ORAL | 1 refills | Status: DC

## 2020-12-18 MED ORDER — LEVETIRACETAM 750 MG PO TABS: 750.0000 mg | ORAL_TABLET | Freq: Two times a day (BID) | ORAL | 1 refills | Status: DC

## 2020-12-19 ENCOUNTER — Ambulatory Visit: Payer: 59 | Admitting: Psychology

## 2020-12-21 ENCOUNTER — Other Ambulatory Visit: Payer: Self-pay | Admitting: Neurology

## 2021-01-08 ENCOUNTER — Ambulatory Visit: Payer: 59 | Admitting: Psychology

## 2021-02-03 NOTE — Telephone Encounter (Signed)
Phone encounter

## 2021-03-08 ENCOUNTER — Encounter: Payer: Self-pay | Admitting: Neurology

## 2021-03-08 ENCOUNTER — Encounter: Payer: Self-pay | Admitting: Adult Health

## 2021-03-11 NOTE — Telephone Encounter (Signed)
Duplicate message. Patient appointment with Butch Penny, NP.

## 2021-03-13 ENCOUNTER — Telehealth (INDEPENDENT_AMBULATORY_CARE_PROVIDER_SITE_OTHER): Payer: 59 | Admitting: Adult Health

## 2021-03-13 DIAGNOSIS — R413 Other amnesia: Secondary | ICD-10-CM | POA: Diagnosis not present

## 2021-03-13 DIAGNOSIS — R561 Post traumatic seizures: Secondary | ICD-10-CM | POA: Diagnosis not present

## 2021-03-13 DIAGNOSIS — S069X2S Unspecified intracranial injury with loss of consciousness of 31 minutes to 59 minutes, sequela: Secondary | ICD-10-CM
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
## 2021-03-14 NOTE — Telephone Encounter (Signed)
I am not sure what all of this means?  Her question about compensation disability and organizations? I am not sure about this and exactly what she is requesting?

## 2021-03-26 ENCOUNTER — Other Ambulatory Visit: Payer: Self-pay | Admitting: Neurology

## 2021-03-29 ENCOUNTER — Emergency Department (HOSPITAL_COMMUNITY): Payer: 59

## 2021-03-29 ENCOUNTER — Emergency Department (HOSPITAL_COMMUNITY)
Admission: EM | Admit: 2021-03-29 | Discharge: 2021-03-29 | Disposition: A | Discharge status: Home / Self Care | Payer: 59 | Attending: Emergency Medicine | Admitting: Emergency Medicine

## 2021-03-29 DIAGNOSIS — G40909 Epilepsy, unspecified, not intractable, without status epilepticus: Secondary | ICD-10-CM | POA: Insufficient documentation

## 2021-03-29 DIAGNOSIS — R569 Unspecified convulsions: Secondary | ICD-10-CM

## 2021-03-29 DIAGNOSIS — Z79899 Other long term (current) drug therapy: Secondary | ICD-10-CM | POA: Diagnosis not present

## 2021-03-29 LAB — RAPID URINE DRUG SCREEN, HOSP PERFORMED
Amphetamines: NOT DETECTED
Barbiturates: NOT DETECTED
Benzodiazepines: NOT DETECTED
Cocaine: NOT DETECTED
Opiates: NOT DETECTED
Tetrahydrocannabinol: NOT DETECTED

## 2021-03-29 LAB — CBC
HCT: 44.4 % (ref 36.0–46.0)
Hemoglobin: 14.8 g/dL (ref 12.0–15.0)
MCH: 29.4 pg (ref 26.0–34.0)
MCHC: 33.3 g/dL (ref 30.0–36.0)
MCV: 88.3 fL (ref 80.0–100.0)
Platelets: 233 10*3/uL (ref 150–400)
RBC: 5.03 MIL/uL (ref 3.87–5.11)
RDW: 12.9 % (ref 11.5–15.5)
WBC: 4.7 10*3/uL (ref 4.0–10.5)
nRBC: 0 % (ref 0.0–0.2)

## 2021-03-29 LAB — BASIC METABOLIC PANEL
Anion gap: 10 (ref 5–15)
BUN: 8 mg/dL (ref 6–20)
CO2: 25 mmol/L (ref 22–32)
Calcium: 9.5 mg/dL (ref 8.9–10.3)
Chloride: 103 mmol/L (ref 98–111)
Creatinine, Ser: 1.07 mg/dL — ABNORMAL HIGH (ref 0.44–1.00)
GFR, Estimated: >60 mL/min (ref >60)
Glucose, Bld: 102 mg/dL — ABNORMAL HIGH (ref 70–99)
Potassium: 3.9 mmol/L (ref 3.5–5.1)
Sodium: 138 mmol/L (ref 135–145)

## 2021-03-29 LAB — URINALYSIS, COMPLETE (UACMP) WITH MICROSCOPIC
Bacteria, UA: NONE SEEN
Bilirubin Urine: NEGATIVE
Glucose, UA: NEGATIVE mg/dL
Hgb urine dipstick: NEGATIVE
Ketones, ur: NEGATIVE mg/dL
Nitrite: NEGATIVE
Protein, ur: NEGATIVE mg/dL
Specific Gravity, Urine: 1.008 (ref 1.005–1.030)
pH: 9 — ABNORMAL HIGH (ref 5.0–8.0)

## 2021-03-29 LAB — I-STAT BETA HCG BLOOD, ED (MC, WL, AP ONLY): I-stat hCG, quantitative: <5 m[IU]/mL (ref <5)

## 2021-03-29 LAB — CBG MONITORING, ED: Glucose-Capillary: 103 mg/dL — ABNORMAL HIGH (ref 70–99)

## 2021-03-29 LAB — MAGNESIUM: Magnesium: 2.1 mg/dL (ref 1.7–2.4)

## 2021-03-29 MED ORDER — LORAZEPAM 2 MG/ML IJ SOLN
1.0000 mg | Freq: Once | INTRAMUSCULAR | Status: AC
  Administered 2021-03-29: 1 mg via INTRAVENOUS
  Filled 2021-03-29: qty 1

## 2021-03-29 MED ORDER — LACTATED RINGERS IV BOLUS
1000.0000 mL | Freq: Once | INTRAVENOUS | Status: AC
  Administered 2021-03-29: 1000 mL via INTRAVENOUS

## 2021-03-29 MED ORDER — GABAPENTIN 400 MG PO CAPS: 400.0000 mg | ORAL_CAPSULE | Freq: Three times a day (TID) | ORAL | 0 refills | Status: DC

## 2021-03-29 MED ORDER — GABAPENTIN 300 MG PO CAPS
400.0000 mg | ORAL_CAPSULE | Freq: Once | ORAL | Status: AC
  Administered 2021-03-29: 400 mg via ORAL
  Filled 2021-03-29: qty 1
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
## 2021-03-29 NOTE — ED Provider Notes (Signed)
[facility] EMERGENCY DEPARTMENT Provider Note   CSN: [id number] Arrival date & time: 03/29/21  1145     History Chief Complaint  Patient presents with   Seizures    Alexis Woods is a 55 y.o. female.  The history is provided by a relative.  Seizures Seizure activity on arrival: no   Seizure type:  Unable to specify Postictal symptoms: confusion and memory loss   Return to baseline: no   Severity:  Unable to specify Timing:  Unable to specify Progression:  Improving Context: previous head injury   Context: not alcohol withdrawal, not sleeping less, not drug use, not fever and medical compliance   Recent head injury:  No recent head injuries PTA treatment:  None History of seizures: yes   Date of initial seizure episode:  1 year ago Date of most recent prior episode:  May Severity:  Unable to specify Seizure control level:  Well controlled Current therapy:  Lamotrigine, levetiracetam and gabapentin Compliance with current therapy:  Good Patient presents after a presumed seizure.  She was found by her daughter in postictal state.  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  She is followed by [facility] neurology.  History is provided by her daughter.  Pain is minimal at 7:30 AM by her husband.  Her daughter called her at 83 AM, patient sounded confused.  This is consistent with her prior episodes of seizure and postictal state.  Currently, patient remains at her mental baseline.  She is able to answer yes/no questions.  She denies any current discomfort, including headache.    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/19/2020   TBI (traumatic brain injury) 03/19/2020   Facial trauma, initial encounter 03/19/2020   Intractable post-traumatic headache 03/19/2020   Sciatica of right side 11/15/2019   Hepatic steatosis 08/01/2019   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 02/03/2017   Bilateral carpal tunnel syndrome 02/03/2017   GERD (gastroesophageal reflux disease) 02/06/2015   High risk medication use 02/06/2015   Epigastric pain 11/16/2014   Lower abdominal pain 11/16/2014   Allergic rhinitis 09/13/2012   Eczema 09/13/2012   History of cleft lip 09/13/2012    Past Surgical History:  Procedure Laterality Date   ABDOMINAL HYSTERECTOMY     CLEFT LIP REPAIR  1987   LUMBAR LAMINECTOMY/ DECOMPRESSION WITH MET-RX Right 06/19/2020   Procedure: Right Lumbar four-five Minimally invasive decompression and synovial cyst resection;  Surgeon: Judith Part, MD;  Location: [facility];  Service: Neurosurgery;  Laterality: Right;     OB History   No obstetric history on file.     No family history on file.  Social History   Tobacco Use   Smoking status: Never   Smokeless tobacco: Never  Vaping Use   Vaping Use: Never used  Substance Use Topics   Alcohol use: Not Currently   Drug use: Not Currently    Home Medications Prior to Admission medications   Medication Sig Start  Date End Date Taking? Authorizing Provider  acetaminophen (TYLENOL) 500 MG tablet Take 500 mg by mouth every 6 (six) hours as needed for mild pain or headache.   Yes [provider]  bacitracin-polymyxin b (POLYSPORIN) ointment Apply 1 application topically daily as needed (wound care).   Yes [provider]  donepezil (ARICEPT) 10 MG tablet TAKE 1 TABLET(10 MG) BY MOUTH AT BEDTIME Patient taking differently: Take 10 mg by  mouth at bedtime. 03/26/21  Yes Dohmeier, Asencion Partridge, MD  lamoTRIgine (LAMICTAL) 200 MG tablet Take 1 tablet (200 mg total) by mouth 2 (two) times daily. 12/03/20  Yes Dohmeier, Asencion Partridge, MD  levETIRAcetam (KEPPRA) 750 MG tablet Take 1 tablet (750 mg total) by mouth 2 (two) times daily. 12/18/20  Yes Dohmeier, Asencion Partridge, MD  Midazolam (NAYZILAM) 5 MG/0.1ML SOLN Use for protracted seizure, 1 spray in one nostril Patient taking differently: Place into the nose See admin instructions. Instill 1 spray AS DIRECTED into one nostril "for a protracted seizure" 12/10/20  Yes Dohmeier, Asencion Partridge, MD  Multiple Vitamin (MULTIVITAMIN) tablet Take 1 tablet by mouth daily.   Yes [provider]  Omega 3-6-9 Fatty Acids (OMEGA-3-6-9 PO) Take 1 capsule by mouth daily.   Yes [provider]  OVER THE COUNTER MEDICATION Take 750 mg by mouth See admin instructions. CuraMed 750 mg softgels- Take 1 softgel by mouth once a day   Yes [provider]  sodium chloride (OCEAN) 0.65 % SOLN nasal spray Place 1 spray into both nostrils as needed for congestion.   Yes [provider]  gabapentin (NEURONTIN) 400 MG capsule Take 1 capsule (400 mg total) by mouth 3 (three) times daily. Take 284m by mouth three times daily, total of 6080mdaily 03/29/21 04/28/21  Horton, KrAlvin CritchleyDO  hydrocortisone cream 1 % Apply 1 application topically 2 (two) times daily as needed (rash). Patient not taking: Reported on 03/29/2021    [provider]    Allergies    Codeine, Oxycodone, Sertraline, and Gluten meal  Review of Systems   Review of Systems  Unable to perform ROS: Mental status change   Physical Exam Updated Vital Signs BP 109/77 (BP Location: Right Arm)   Pulse 61   Temp 97.8 F (36.6 C) (Oral)   Resp 14   Ht '5\' 5"'  (1.651 m)   Wt 68 kg   SpO2 98%   BMI 24.96 kg/m   Physical Exam Vitals and nursing note reviewed.  Constitutional:      General: She is awake. She is not in acute distress.     Appearance: Normal appearance. She is well-developed and normal weight. She is not ill-appearing, toxic-appearing or diaphoretic.  HENT:     Head: Normocephalic and atraumatic.     Right Ear: External ear normal.     Left Ear: External ear normal.     Nose: Nose normal.     Mouth/Throat:     Mouth: Mucous membranes are moist.     Pharynx: Oropharynx is clear.     Comments: No tongue bite. Eyes:     Extraocular Movements: Extraocular movements intact.     Conjunctiva/sclera: Conjunctivae normal.     Pupils: Pupils are equal, round, and reactive to light.  Cardiovascular:     Rate and Rhythm: Normal rate and regular rhythm.     Heart sounds: No murmur heard. Pulmonary:     Effort: Pulmonary effort is normal. No respiratory distress.     Breath sounds: Normal breath sounds. No wheezing.  Chest:     Chest wall: No tenderness.  Abdominal:     Palpations: Abdomen is soft.     Tenderness: There is no abdominal tenderness. There is no right CVA tenderness or left CVA tenderness.  Musculoskeletal:        General: No tenderness or signs of injury. Normal range of motion.     Cervical back: Normal range of motion and neck supple. No rigidity.     Right lower leg: No edema.     Left lower leg: No edema.  Skin:    General: Skin is warm and dry.     Capillary Refill: Capillary refill takes less than 2 seconds.     Coloration: Skin is not jaundiced or pale.  Neurological:     General: No focal deficit present.     Mental Status: She is disoriented and confused.     GCS: GCS eye subscore is 4. GCS verbal subscore is 4. GCS motor subscore is 6.     Cranial Nerves: Cranial nerves are intact. No cranial nerve deficit, dysarthria or facial asymmetry.     Sensory: Sensation is intact. No sensory deficit.     Motor: Motor function is intact. No abnormal muscle tone or pronator drift.     Coordination: Coordination is intact. Finger-Nose-Finger Test normal.  Psychiatric:        Behavior:  Behavior is cooperative.    ED Results / Procedures / Treatments   Labs (all labs ordered are listed, but only abnormal results are displayed) Labs Reviewed  BASIC METABOLIC PANEL - Abnormal; Notable for the following components:      Result Value   Glucose, Bld 102 (*)    Creatinine, Ser 1.07 (*)    All other components within normal limits  URINALYSIS, COMPLETE (UACMP) WITH MICROSCOPIC - Abnormal; Notable for the following components:   Color, Urine STRAW (*)    pH 9.0 (*)    Leukocytes,Ua LARGE (*)    All other components within normal limits  CBG MONITORING, ED - Abnormal; Notable for the following components:   Glucose-Capillary 103 (*)    All other components within normal limits  CBC  RAPID URINE DRUG SCREEN, HOSP PERFORMED  MAGNESIUM  I-STAT BETA HCG BLOOD, ED ([facility], [facility], [facility] ONLY)    EKG EKG Interpretation  Date/Time:  Friday March 29 2021 12:55:34 EDT Ventricular Rate:  52 PR Interval:  133 QRS Duration: 108 QT Interval:  470 QTC Calculation: 438 R Axis:   46 Text Interpretation: Sinus rhythm Abnormal R-wave progression, early transition Confirmed by Madalyn Rob [phone]) on 03/30/2021 4:19:02 PM  Radiology CT HEAD WO CONTRAST (5MM)  Result Date: 03/29/2021 CLINICAL DATA:  A 55 year old female presents with mental status changes. EXAM: CT HEAD WITHOUT CONTRAST TECHNIQUE: Contiguous axial images were obtained from the base of the skull through the vertex without intravenous contrast. COMPARISON:  Comparison made with July 02, 2020. FINDINGS: Brain: No evidence of acute infarction, hemorrhage, hydrocephalus, extra-axial collection or mass lesion/mass effect. Vascular: No hyperdense vessel or unexpected calcification. Skull: Normal. Negative for fracture or focal lesion. Sinuses/Orbits: Visualized paranasal sinuses and orbits are unremarkable. Other: None IMPRESSION: No acute intracranial abnormality. Electronically Signed   By: Zetta Bills M.D.   On: 03/29/2021  14:48    Procedures Procedures   Medications Ordered in ED Medications  lactated ringers bolus 1,000 mL (0 mLs Intravenous Stopped 03/29/21 1734)  LORazepam (ATIVAN) injection 1 mg (1 mg Intravenous Given 03/29/21 1343)  gabapentin (NEURONTIN) capsule 400  mg (400 mg Oral Given 03/29/21 1819)    ED Course  I have reviewed the triage vital signs and the nursing notes.  Pertinent labs & imaging results that were available during my care of the patient were reviewed by me and considered in my medical decision making (see chart for details).    MDM Rules/Calculators/A&P                           Patient presents for altered mental status due to presumed post-ictal state. She is well appearing on arrival but unable to provide history. Her daughter states that her condition is consistent with prior episodes of post-ictal confusion.  No other significant exam. Vital signs are normal. Patient has no focal neurologic deficits. She is clearly confused and not able to answer any questions beyond yes and no. Workup was initiated. Low suspicion of stroke given history and absence of focal findings. However, presumed seizure was unwitnessed, so will obtain CT head to rule out new intracranial process and new traumatic injuries. IVF was given.   On reassessment, patient had slightly improved mentation. Daughter remained at bedside. Daughter requested ativan, which helped her during a prolonged post-ictal state after her last seizure in May. This was ordered. Labwork was notable for alkaline urine. Do not suspect salicylate toxicity given no serum abnormalities. CT of head showed NAICA.   On further reassessment, patient showed continued improvement. At this point, she was A&O x4. She continued to deny any physical complaints. She denies any precipitating factors that may have contributed to her breakthrough seizure. I discussed the patient with neurologist on call. He recommended increasing gabapentin to  424m TID and follow up in neuro clinic. This was relayed to the patient. At this point, her daughter had left to get food. Patient to be ambulated prior to discharge. Care of patient signed out to oncoming ED provider.   Final Clinical Impression(s) / ED Diagnoses Final diagnoses:  Seizure ([place]    Rx / DC Orders ED Discharge Orders          Ordered    gabapentin (NEURONTIN) 400 MG capsule  3 times daily        03/29/21 1748             DGodfrey Pick MD 03/30/21 1629

## 2021-03-29 NOTE — ED Provider Notes (Signed)
Patient signed out to me by previous provider. Please refer to their note for full HPI.  Briefly this is a 55 year old female with past medical history of epilepsy who presents with what is assumed to be a breakthrough seizure.  Work-up has been otherwise unremarkable.  She follows with [facility] neurology.  Previous physician consulted [facility] neurology on-call.  They recommend increasing her gabapentin dose from 100 mg 3 times daily to 400 mg 3 times daily. Physical Exam  BP 109/77 (BP Location: Right Arm)   Pulse 61   Temp 97.8 F (36.6 C) (Oral)   Resp 14   Ht 5\' 5"  (1.651 m)   Wt 68 kg   SpO2 98%   BMI 24.96 kg/m   Physical Exam Vitals and nursing note reviewed.  Constitutional:      General: She is not in acute distress.    Appearance: Normal appearance.  HENT:     Head: Normocephalic.     Mouth/Throat:     Mouth: Mucous membranes are moist.  Cardiovascular:     Rate and Rhythm: Normal rate.  Pulmonary:     Effort: Pulmonary effort is normal. No respiratory distress.  Skin:    General: Skin is warm.  Neurological:     Mental Status: She is alert and oriented to person, place, and time. Mental status is at baseline.     Cranial Nerves: No cranial nerve deficit.  Psychiatric:        Mood and Affect: Mood normal.    ED Course/Procedures     Procedures  MDM   Patient has had no further seizure activity since I assumed care.  I spoke in depth with the patient and her family at bedside about the gabapentin dose increase.  A Lamictal level has been sent and will be follow-up as an outpatient with neurology.  She otherwise knows to take her other medications as directed.  A new prescription for the gabapentin has been sent as well so that she does not run out of her prescription at the increased dose.  Patient is sitting up, conversational, well-appearing, able to p.o., ambulatory without assistance and comfortable with going home.  Patient at this time appears safe and stable  for discharge and will be treated as an outpatient.  Discharge plan and strict return to ED precautions discussed, patient verbalizes understanding and agreement.       , DO 03/29/21 1826

## 2021-03-29 NOTE — ED Notes (Signed)
Pt ambulated in the halls with a steady gait and required no assistance to ambulate.

## 2021-03-29 NOTE — Discharge Instructions (Addendum)
Increase gabapentin dose to 400 mg, 3 times a day.  Take other antiepileptic medications (Lamictal and Keppra) as previously prescribed.  Please follow-up with your neurologist as soon as possible.

## 2021-04-01 ENCOUNTER — Encounter: Payer: Self-pay | Admitting: Neurology

## 2021-04-02 ENCOUNTER — Telehealth: Payer: Self-pay | Admitting: Adult Health

## 2021-04-02 NOTE — Telephone Encounter (Signed)
I will not see this patient in the clinic or through a virtual visit. I do not feel that we have a good provider/patient relationship. Please consult with Dr. Vickey Huger about ongoing follow-up after this seizure.

## 2021-04-02 NOTE — Telephone Encounter (Signed)
Pt currently conversing with clinic via mychart.

## 2021-04-02 NOTE — Telephone Encounter (Signed)
Pt called stating that she was just in the ER and is wanting to discuss with RN to see if she is needing to be seen sooner than scheduled appt. Please advise.

## 2021-04-02 NOTE — Telephone Encounter (Signed)
LMVM for pt that returned call. I did see that was in ED for breakthru sz.

## 2021-05-01 ENCOUNTER — Emergency Department (HOSPITAL_COMMUNITY)
Admission: EM | Admit: 2021-05-01 | Discharge: 2021-05-01 | Disposition: A | Discharge status: Home / Self Care | Payer: 59 | Attending: Emergency Medicine | Admitting: Emergency Medicine

## 2021-05-01 DIAGNOSIS — Y900 Blood alcohol level of less than 20 mg/100 ml: Secondary | ICD-10-CM | POA: Insufficient documentation

## 2021-05-01 DIAGNOSIS — Z87820 Personal history of traumatic brain injury: Secondary | ICD-10-CM | POA: Diagnosis not present

## 2021-05-01 DIAGNOSIS — Z79899 Other long term (current) drug therapy: Secondary | ICD-10-CM | POA: Diagnosis not present

## 2021-05-01 DIAGNOSIS — R569 Unspecified convulsions: Secondary | ICD-10-CM | POA: Diagnosis not present

## 2021-05-01 LAB — COMPREHENSIVE METABOLIC PANEL
ALT: 17 U/L (ref 0–44)
AST: 23 U/L (ref 15–41)
Albumin: 4.5 g/dL (ref 3.5–5.0)
Alkaline Phosphatase: 77 U/L (ref 38–126)
Anion gap: 6 (ref 5–15)
BUN: 11 mg/dL (ref 6–20)
CO2: 27 mmol/L (ref 22–32)
Calcium: 9.3 mg/dL (ref 8.9–10.3)
Chloride: 105 mmol/L (ref 98–111)
Creatinine, Ser: 1.08 mg/dL — ABNORMAL HIGH (ref 0.44–1.00)
GFR, Estimated: >60 mL/min (ref >60)
Glucose, Bld: 103 mg/dL — ABNORMAL HIGH (ref 70–99)
Potassium: 4.2 mmol/L (ref 3.5–5.1)
Sodium: 138 mmol/L (ref 135–145)
Total Bilirubin: 1 mg/dL (ref 0.3–1.2)
Total Protein: 7.5 g/dL (ref 6.5–8.1)

## 2021-05-01 LAB — CBC WITH DIFFERENTIAL/PLATELET
Abs Immature Granulocytes: 0.01 10*3/uL (ref 0.00–0.07)
Basophils Absolute: 0.1 10*3/uL (ref 0.0–0.1)
Basophils Relative: 1 %
Eosinophils Absolute: 0 10*3/uL (ref 0.0–0.5)
Eosinophils Relative: 1 %
HCT: 44.2 % (ref 36.0–46.0)
Hemoglobin: 14.5 g/dL (ref 12.0–15.0)
Immature Granulocytes: 0 %
Lymphocytes Relative: 35 %
Lymphs Abs: 1.9 10*3/uL (ref 0.7–4.0)
MCH: 29 pg (ref 26.0–34.0)
MCHC: 32.8 g/dL (ref 30.0–36.0)
MCV: 88.4 fL (ref 80.0–100.0)
Monocytes Absolute: 0.3 10*3/uL (ref 0.1–1.0)
Monocytes Relative: 5 %
Neutro Abs: 3.2 10*3/uL (ref 1.7–7.7)
Neutrophils Relative %: 58 %
Platelets: 233 10*3/uL (ref 150–400)
RBC: 5 MIL/uL (ref 3.87–5.11)
RDW: 12.6 % (ref 11.5–15.5)
WBC: 5.5 10*3/uL (ref 4.0–10.5)
nRBC: 0 % (ref 0.0–0.2)

## 2021-05-01 LAB — URINALYSIS, ROUTINE W REFLEX MICROSCOPIC
Bacteria, UA: NONE SEEN
Bilirubin Urine: NEGATIVE
Glucose, UA: NEGATIVE mg/dL
Ketones, ur: NEGATIVE mg/dL
Nitrite: NEGATIVE
Protein, ur: NEGATIVE mg/dL
Specific Gravity, Urine: 1.004 — ABNORMAL LOW (ref 1.005–1.030)
pH: 7 (ref 5.0–8.0)

## 2021-05-01 LAB — ETHANOL: Alcohol, Ethyl (B): <10 mg/dL (ref <10)

## 2021-05-01 MED ORDER — GABAPENTIN 300 MG PO CAPS
300.0000 mg | ORAL_CAPSULE | Freq: Once | ORAL | Status: AC
  Administered 2021-05-01: 300 mg via ORAL
  Filled 2021-05-01: qty 1

## 2021-05-01 MED ORDER — LORAZEPAM 2 MG/ML IJ SOLN
INTRAMUSCULAR | Status: AC
  Administered 2021-05-01: 1 mg via INTRAVENOUS
  Filled 2021-05-01: qty 1

## 2021-05-01 MED ORDER — LORAZEPAM 2 MG/ML IJ SOLN
1.0000 mg | Freq: Once | INTRAMUSCULAR | Status: AC
  Administered 2021-05-01: 1 mg via INTRAVENOUS

## 2021-05-01 MED ORDER — SODIUM CHLORIDE 0.9 % IV SOLN
750.0000 mg | Freq: Two times a day (BID) | INTRAVENOUS | Status: DC
  Administered 2021-05-01: 750 mg via INTRAVENOUS
  Filled 2021-05-01 (×2): qty 7.5

## 2021-05-01 NOTE — ED Notes (Signed)
Entered room to find pt actively seizing.  HR elevated to 146, SPO2 61% on RA.  Seizure activity lasted approximately one minute.  Non re breather placed on pt with improvement in O2 sat to 100%.  Non re breather removed and pt placed on 2L O2 via [place].  Norberta Keens, RN to bedside w/ 1 mg ativan.  Pt post ictal.  Per pt's daughter this is not pt's normal.  Per daughter, pt has not had seizure activity in hospital prior to this episode.

## 2021-05-01 NOTE — ED Provider Notes (Signed)
[name] DEPT Provider Note   CSN: [id number] Arrival date & time: 05/01/21  1159     History Chief Complaint  Patient presents with   Seizures    Alexis Woods is a 55 y.o. female.  HPI Patient presents from home after possible seizure.  The patient is slow to respond verbally, but does interact, denies pain, denies focal weakness.  She cannot provide any details of her history prior to ED arrival.  Level 5 caveat secondary to mental status change. History is obtained by EMS providers from daughter.  Patient seemingly did not take her medication last night, had a witnessed seizure about 2 hours prior to ED arrival.  Patient has a history of TBI, subsequently developed seizures.  Following today's seizure activity patient did receive antiepileptic medication.  EMS reports no hemodynamic instability, no hypoxia, no tachycardia in route.    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   TBI (traumatic brain injury) 03/19/2020   Facial trauma, initial encounter 03/19/2020   Intractable post-traumatic headache 03/19/2020   Sciatica of right side 11/15/2019   Hepatic steatosis 08/01/2019   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 02/03/2017   Bilateral carpal tunnel syndrome 02/03/2017   GERD (gastroesophageal reflux disease) 02/06/2015   High risk medication use 02/06/2015   Epigastric pain 11/16/2014   Lower abdominal pain 11/16/2014   Allergic rhinitis  09/13/2012   Eczema 09/13/2012   History of cleft lip 09/13/2012    Past Surgical History:  Procedure Laterality Date   ABDOMINAL HYSTERECTOMY     CLEFT LIP REPAIR  1987   LUMBAR LAMINECTOMY/ DECOMPRESSION WITH MET-RX Right 06/19/2020   Procedure: Right Lumbar four-five Minimally invasive decompression and synovial cyst resection;  Surgeon: Judith Part, MD;  Location: [place];  Service: Neurosurgery;  Laterality: Right;     OB History   No obstetric history on file.     No family history on file.  Social History   Tobacco Use   Smoking status: Never   Smokeless tobacco: Never  Vaping Use   Vaping Use: Never used  Substance Use Topics   Alcohol use: Not Currently   Drug use: Not Currently    Home Medications Prior to Admission medications   Medication Sig Start Date End Date Taking? Authorizing Provider  gabapentin (NEURONTIN) 100 MG capsule Take 200 mg by mouth 3 (three) times daily.   Yes [provider]  acetaminophen (TYLENOL) 500 MG tablet Take 500 mg by mouth every 6 (six) hours as needed for mild pain or headache.    [provider]  bacitracin-polymyxin b (POLYSPORIN) ointment Apply 1 application topically daily as needed (wound care).    [provider]  donepezil (ARICEPT) 10 MG tablet TAKE 1 TABLET(10 MG) BY MOUTH AT BEDTIME Patient taking differently: Take 10 mg by mouth at bedtime. 03/26/21   Dohmeier, Asencion Partridge, MD  gabapentin (NEURONTIN) 400 MG capsule Take 1 capsule (400 mg total) by mouth 3 (three) times daily. Take 223m by mouth three times daily,  total of 658m daily Patient not taking: Reported on 05/01/2021 03/29/21 04/28/21  Horton, KAlvin Critchley DO  lamoTRIgine (LAMICTAL) 150 MG tablet Take 150 mg by mouth 2 (two) times daily. 04/15/21   [provider]  lamoTRIgine (LAMICTAL) 200 MG tablet Take 1 tablet (200 mg total) by mouth 2 (two) times daily. Patient not taking: Reported on 05/01/2021 12/03/20   Dohmeier, CAsencion Partridge  MD  levETIRAcetam (KEPPRA) 750 MG tablet Take 1 tablet (750 mg total) by mouth 2 (two) times daily. 12/18/20   Dohmeier, CAsencion Partridge MD  Midazolam (NAYZILAM) 5 MG/0.1ML SOLN Use for protracted seizure, 1 spray in one nostril Patient taking differently: Place into the nose See admin instructions. Instill 1 spray AS DIRECTED into one nostril "for a protracted seizure" 12/10/20   Dohmeier, CAsencion Partridge MD  Multiple Vitamin (MULTIVITAMIN) tablet Take 1 tablet by mouth daily.    [provider]  Omega 3-6-9 Fatty Acids (OMEGA-3-6-9 PO) Take 1 capsule by mouth daily.    [provider]  OVER THE COUNTER MEDICATION Take 750 mg by mouth See admin instructions. CuraMed 750 mg softgels- Take 1 softgel by mouth once a day    [provider]  sodium chloride (OCEAN) 0.65 % SOLN nasal spray Place 1 spray into both nostrils as needed for congestion.    [provider]    Allergies    Codeine, Oxycodone, Sertraline, and Gluten meal  Review of Systems   Review of Systems  Unable to perform ROS: Acuity of condition   Physical Exam Updated Vital Signs BP 96/73   Pulse 67   Temp 97.8 F (36.6 C) (Oral)   Resp 16   SpO2 100%   Physical Exam Vitals and nursing note reviewed.  Constitutional:      General: She is not in acute distress.    Appearance: She is well-developed.     Comments: Withdrawn, thin adult female in no distress  HENT:     Head: Normocephalic and atraumatic.  Eyes:     Conjunctiva/sclera: Conjunctivae normal.  Cardiovascular:     Rate and Rhythm: Normal rate and regular rhythm.  Pulmonary:     Effort: Pulmonary effort is normal. No respiratory distress.     Breath sounds: Normal breath sounds. No stridor.  Abdominal:     General: There is no distension.  Skin:    General: Skin is warm and dry.  Neurological:     Cranial Nerves: No cranial nerve deficit.     Comments: Moves all extremities spontaneously, slowly follows commands appropriately no  facial asymmetry speech is very brief, but clear.  Psychiatric:     Comments: Delayed interactivity, answers simple questions briefly.  No additional insight into her condition.    ED Results / Procedures / Treatments   Labs (all labs ordered are listed, but only abnormal results are displayed) Labs Reviewed  COMPREHENSIVE METABOLIC PANEL - Abnormal; Notable for the following components:      Result Value   Glucose, Bld 103 (*)    Creatinine, Ser 1.08 (*)    All other components within normal limits  URINALYSIS, ROUTINE W REFLEX MICROSCOPIC - Abnormal; Notable for the following components:   Color, Urine STRAW (*)    Specific Gravity, Urine 1.004 (*)    Hgb urine dipstick SMALL (*)    Leukocytes,Ua MODERATE (*)    All other components within normal limits  ETHANOL  CBC WITH DIFFERENTIAL/PLATELET    EKG None  Radiology No results found.  Procedures Procedures  Medications Ordered in ED Medications  levETIRAcetam (KEPPRA) 750 mg in sodium chloride 0.9 % 100 mL IVPB (0 mg Intravenous Stopped 05/01/21 1415)  gabapentin (NEURONTIN) tablet 300 mg (has no administration in time range)  LORazepam (ATIVAN) injection 1 mg (1 mg Intravenous Given 05/01/21 1324)    ED Course  I have reviewed the triage vital signs and the nursing notes.  Pertinent labs & imaging results that were available during my care of the patient were reviewed by me and considered in my medical decision making (see chart for details). Cardiac 70 sinus normal Pulse ox 99% room air normal   4:34 PM Patient awake, alert, in no distress, speaking clearly, we discussed today's evaluation.  Labs reassuring, and after her initial seizure-like episode soon after arrival she has had no additional episodes.  We discussed the importance of taking medication as directed, with no new seizure activity, no substantial lab abnormalities, no hypoxia, no ongoing complaints, patient discharged in stable condition. MDM  Rules/Calculators/A&P MDM Number of Diagnoses or Management Options Seizure ([place]): established, worsening   Amount and/or Complexity of Data Reviewed Clinical lab tests: ordered and reviewed Tests in the medicine section of CPT: reviewed and ordered Decide to obtain previous medical records or to obtain history from someone other than the patient: yes Obtain history from someone other than the patient: yes Review and summarize past medical records: yes Independent visualization of images, tracings, or specimens: yes  Risk of Complications, Morbidity, and/or Mortality Presenting problems: high Diagnostic procedures: high Management options: high  Critical Care Total time providing critical care: < 30 minutes  Patient Progress Patient progress: improved   Final Clinical Impression(s) / ED Diagnoses Final diagnoses:  Seizure ([place])     Carmin Muskrat, MD 05/01/21 1635

## 2021-05-01 NOTE — Telephone Encounter (Signed)
Thank you for the update. Patient still in ED per Epic data. Another seizure.  Level 5 caveat secondary to mental status change. History is obtained by EMS providers from daughter.  Patient seemingly did not take her medication last night, had a witnessed seizure about 2 hours prior to ED arrival.  Patient has a history of TBI, subsequently developed seizures.  Following today's seizure activity patient did receive antiepileptic medication.  EMS reports no hemodynamic instability, no hypoxia, no tachycardia in route.

## 2021-05-01 NOTE — Discharge Instructions (Signed)
As discussed, today's evaluation has been generally reassuring.  With your seizure history it is very important that you take all your medication as prescribed, as scheduled.  Return here for concerning changes, otherwise follow-up with your physician.

## 2021-05-01 NOTE — Telephone Encounter (Signed)
I called Alexis Woods back. She stated she ended up calling 911 and the Alexis Woods is on the way to [facility].  She states that when she saw her the Alexis Woods knew her name and was able to take her medications but then she fell back asleep and was lethargic.  The Alexis Woods did not take any of her nighttime medications last night.  I let her know a message would be sent to Dr. Vickey Huger. She verbalized appreciation for the call.

## 2021-05-01 NOTE — Telephone Encounter (Signed)
Pt's daughter Alexis Woods called, states her mother had a seizure this morning she is not sure how long it lasted because she was not there. She states her mother did not take her medicine on yesterday but she went over to the home and gave her the medicine today herself. [name] states her mother did know her name this time, and she is sleeping now. Alexis Woods is wanting to know should she take her to the hospital or not, requesting a call back.

## 2021-05-01 NOTE — ED Notes (Signed)
Seizure pads applied to pt bed rails at this time.

## 2021-05-01 NOTE — ED Triage Notes (Addendum)
Pt BIB EMS. Pt coming from home, per daughter pt had a seizure around 1045. Pt did not take seizure meds last night. Hx of TBI a year ago that causes seizures. Per EMS pt is still in postical state.Per pts daughter it takes pt about 6 hours to get out of postical state. Per EMS daughter did give pt seizure medication at 11am but still wanted pt to come to the ER. Per EMS pt did not have any seizure like activities with them.   BP-132/76 O2-100%RA  HR-72 CBG-94

## 2021-05-02 ENCOUNTER — Encounter: Payer: Self-pay | Admitting: *Deleted

## 2021-05-02 NOTE — Telephone Encounter (Signed)
Pt was d/c from ER with instructions to f/u with doctor as needed if she worsens. Pt already had appt scheduled with Alexis Millet NP but pt should see MD now. Appt canceled and Dr Dohmeier's nurse will be on lookout for open appt.

## 2021-06-06 ENCOUNTER — Ambulatory Visit: Payer: 59 | Admitting: Psychology

## 2021-06-18 ENCOUNTER — Ambulatory Visit: Payer: 59 | Admitting: Adult Health

## 2021-08-17 ENCOUNTER — Other Ambulatory Visit: Payer: Self-pay | Admitting: Neurology

## 2022-06-22 IMAGING — RF DG LUMBAR SPINE 2-3V
1 series · 2 of 2 positions shown · non-contrast
Comparison: MRI lumbar spine May 31, 2020.

CLINICAL DATA: L4-L5 minimally invasive decompression and synovial
cyst resection.

EXAM:
LUMBAR SPINE - 2-3 VIEW; DG C-ARM 1-60 MIN

[Series 1: run · 2 of 2 slices shown]
[im 1/2]
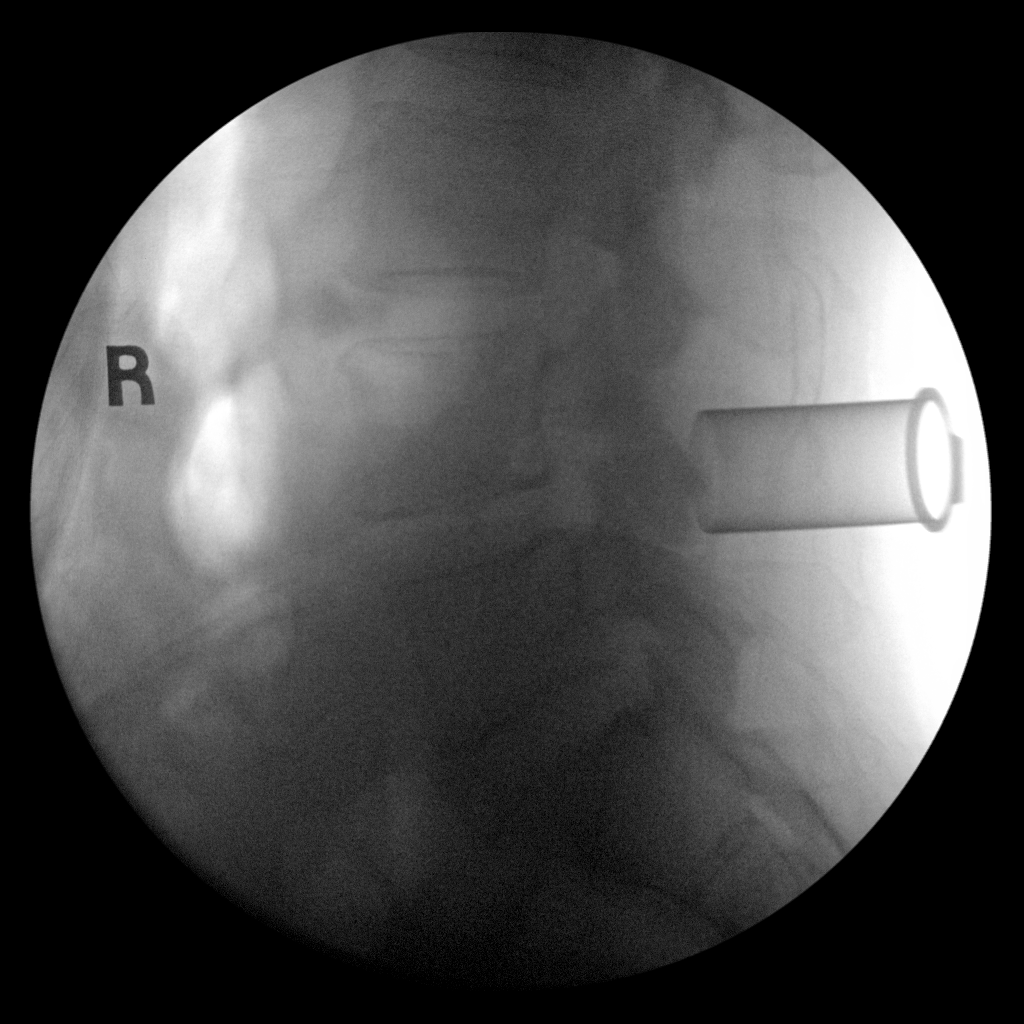
[im 2/2]
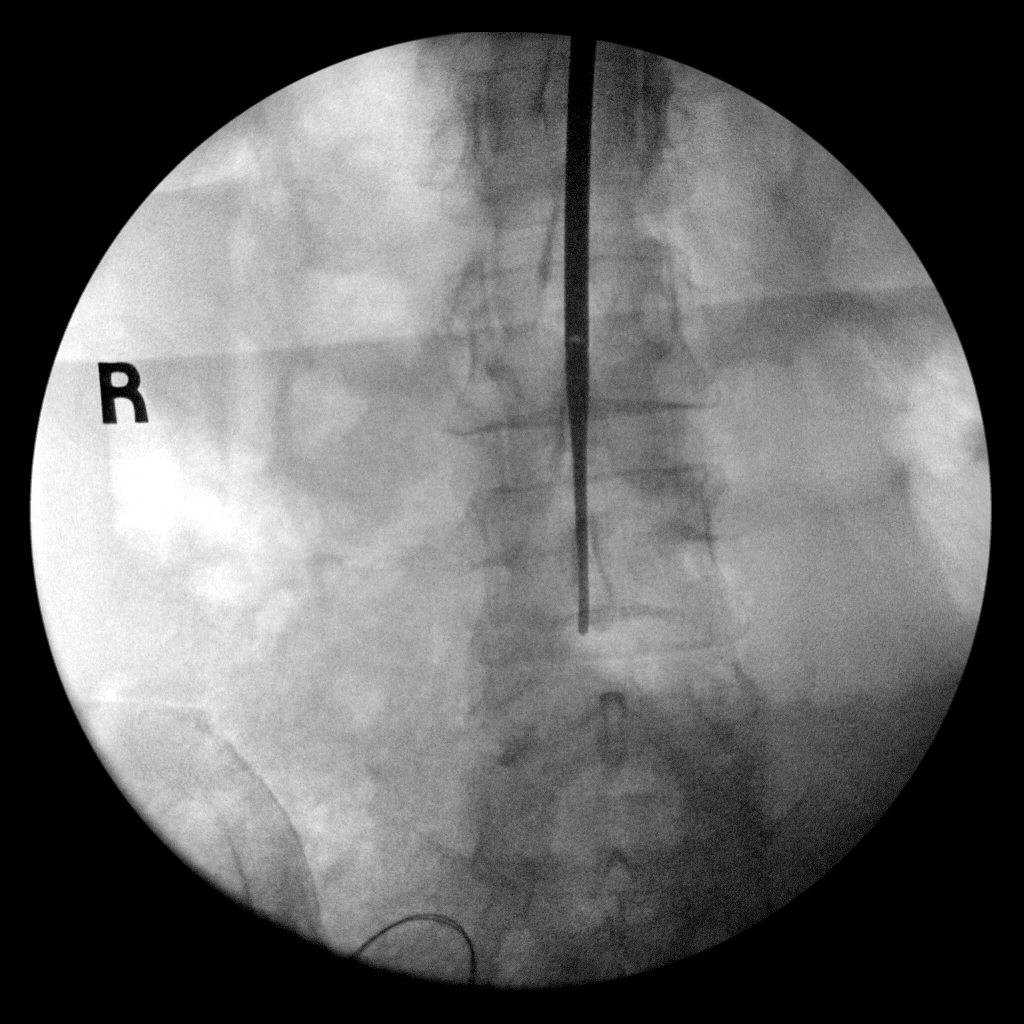

[2 of 2 positions shown; findings below may reference images not displayed]

FINDINGS: Fluoro time: 8 seconds.

Radiation: 1.38 MGy.

Two C-arm fluoroscopic images were obtained intraoperatively and
submitted for post operative interpretation. The first image
demonstrates a tubular retractor device in the posterior paraspinal
soft tissues at the L4-L5 level. The second image demonstrates
surgical probe tip projecting over the right paramidline L4-L5
space. Please see the performing provider's procedural report for
further detail.
IMPRESSION: Intraoperative fluoroscopic imaging, as detailed above.

## 2022-06-22 IMAGING — RF DG C-ARM 1-60 MIN
1 series · 2 of 2 positions shown · non-contrast
Comparison: MRI lumbar spine May 31, 2020.

CLINICAL DATA: L4-L5 minimally invasive decompression and synovial
cyst resection.

EXAM:
LUMBAR SPINE - 2-3 VIEW; DG C-ARM 1-60 MIN

[Series 1: run · 2 of 2 slices shown]
[im 1/2]
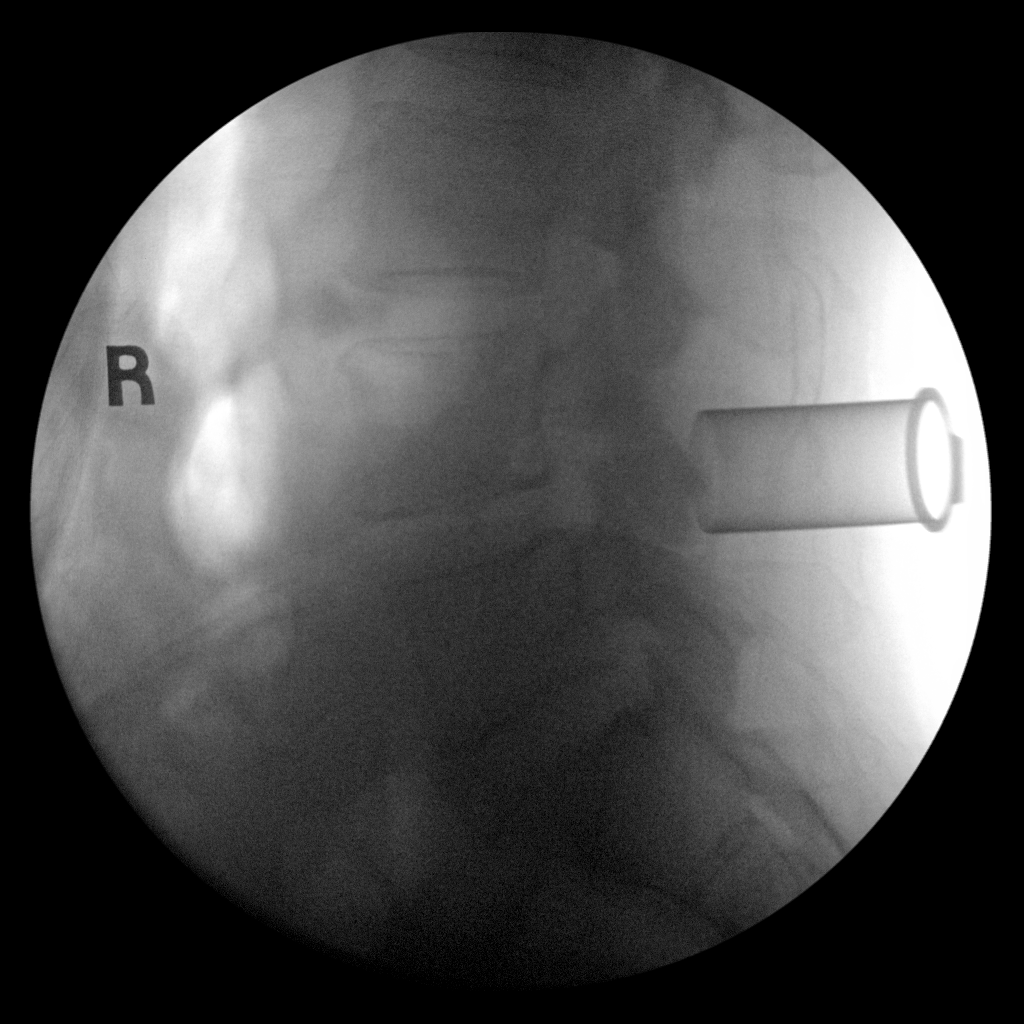
[im 2/2]
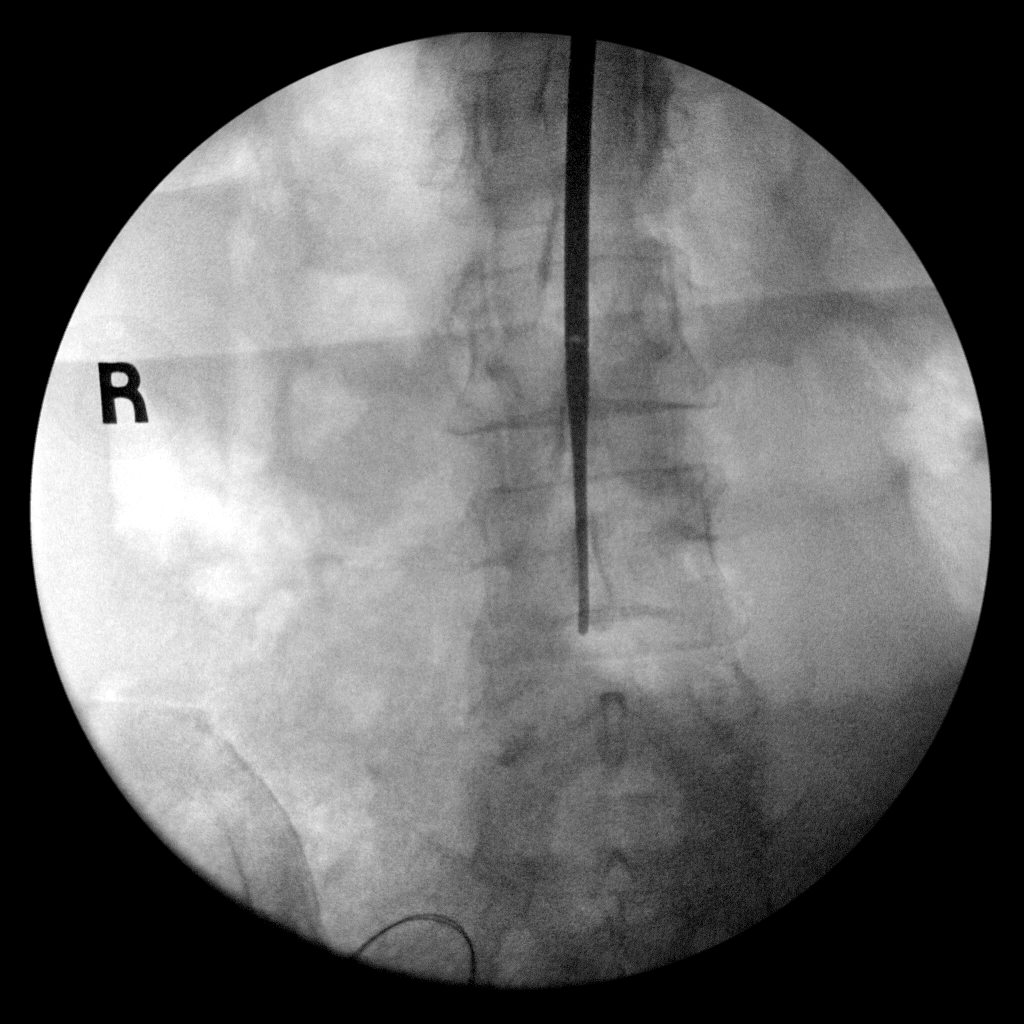

[2 of 2 positions shown; findings below may reference images not displayed]

FINDINGS: Fluoro time: 8 seconds.

Radiation: 1.38 MGy.

Two C-arm fluoroscopic images were obtained intraoperatively and
submitted for post operative interpretation. The first image
demonstrates a tubular retractor device in the posterior paraspinal
soft tissues at the L4-L5 level. The second image demonstrates
surgical probe tip projecting over the right paramidline L4-L5
space. Please see the performing provider's procedural report for
further detail.
IMPRESSION: Intraoperative fluoroscopic imaging, as detailed above.

## 2022-07-03 ENCOUNTER — Inpatient Hospital Stay (HOSPITAL_COMMUNITY): Payer: Self-pay

## 2022-07-03 ENCOUNTER — Encounter (HOSPITAL_COMMUNITY): Payer: Self-pay

## 2022-07-03 ENCOUNTER — Other Ambulatory Visit: Payer: Self-pay

## 2022-07-03 ENCOUNTER — Inpatient Hospital Stay (HOSPITAL_COMMUNITY)
Admission: EM | Admit: 2022-07-03 | Discharge: 2022-07-05 | DRG: 101 | Disposition: A | Discharge status: Home / Self Care | Payer: Self-pay | Attending: Internal Medicine | Admitting: Internal Medicine

## 2022-07-03 ENCOUNTER — Emergency Department (HOSPITAL_COMMUNITY): Payer: Self-pay

## 2022-07-03 DIAGNOSIS — Z87820 Personal history of traumatic brain injury: Secondary | ICD-10-CM

## 2022-07-03 DIAGNOSIS — R569 Unspecified convulsions: Secondary | ICD-10-CM

## 2022-07-03 DIAGNOSIS — Z885 Allergy status to narcotic agent status: Secondary | ICD-10-CM

## 2022-07-03 DIAGNOSIS — S069XAA Unspecified intracranial injury with loss of consciousness status unknown, initial encounter: Secondary | ICD-10-CM | POA: Diagnosis present

## 2022-07-03 DIAGNOSIS — I959 Hypotension, unspecified: Secondary | ICD-10-CM | POA: Diagnosis present

## 2022-07-03 DIAGNOSIS — G40909 Epilepsy, unspecified, not intractable, without status epilepticus: Principal | ICD-10-CM

## 2022-07-03 DIAGNOSIS — Z91148 Patient's other noncompliance with medication regimen for other reason: Secondary | ICD-10-CM

## 2022-07-03 DIAGNOSIS — Z79899 Other long term (current) drug therapy: Secondary | ICD-10-CM

## 2022-07-03 DIAGNOSIS — Z91018 Allergy to other foods: Secondary | ICD-10-CM

## 2022-07-03 DIAGNOSIS — Z888 Allergy status to other drugs, medicaments and biological substances status: Secondary | ICD-10-CM

## 2022-07-03 HISTORY — DX: Unspecified intracranial injury with loss of consciousness status unknown, initial encounter: S06.9XAA

## 2022-07-03 LAB — CBC WITH DIFFERENTIAL/PLATELET
Abs Immature Granulocytes: 0.02 10*3/uL (ref 0.00–0.07)
Basophils Absolute: 0.1 10*3/uL (ref 0.0–0.1)
Basophils Relative: 1 %
Eosinophils Absolute: 0 10*3/uL (ref 0.0–0.5)
Eosinophils Relative: 1 %
HCT: 42.4 % (ref 36.0–46.0)
Hemoglobin: 13.4 g/dL (ref 12.0–15.0)
Immature Granulocytes: 0 %
Lymphocytes Relative: 28 %
Lymphs Abs: 1.6 10*3/uL (ref 0.7–4.0)
MCH: 29.8 pg (ref 26.0–34.0)
MCHC: 31.6 g/dL (ref 30.0–36.0)
MCV: 94.2 fL (ref 80.0–100.0)
Monocytes Absolute: 0.3 10*3/uL (ref 0.1–1.0)
Monocytes Relative: 5 %
Neutro Abs: 3.7 10*3/uL (ref 1.7–7.7)
Neutrophils Relative %: 65 %
Platelets: 192 10*3/uL (ref 150–400)
RBC: 4.5 MIL/uL (ref 3.87–5.11)
RDW: 12.8 % (ref 11.5–15.5)
WBC: 5.6 10*3/uL (ref 4.0–10.5)
nRBC: 0 % (ref 0.0–0.2)

## 2022-07-03 LAB — CBG MONITORING, ED
Glucose-Capillary: 105 mg/dL — ABNORMAL HIGH (ref 70–99)
Glucose-Capillary: 102 mg/dL — ABNORMAL HIGH (ref 70–99)

## 2022-07-03 LAB — COMPREHENSIVE METABOLIC PANEL
ALT: 12 U/L (ref 0–44)
AST: 25 U/L (ref 15–41)
Albumin: 3.9 g/dL (ref 3.5–5.0)
Alkaline Phosphatase: 62 U/L (ref 38–126)
Anion gap: 15 (ref 5–15)
BUN: 12 mg/dL (ref 6–20)
CO2: 15 mmol/L — ABNORMAL LOW (ref 22–32)
Calcium: 8.8 mg/dL — ABNORMAL LOW (ref 8.9–10.3)
Chloride: 108 mmol/L (ref 98–111)
Creatinine, Ser: 1.2 mg/dL — ABNORMAL HIGH (ref 0.44–1.00)
GFR, Estimated: 53 mL/min — ABNORMAL LOW (ref >60)
Glucose, Bld: 97 mg/dL (ref 70–99)
Potassium: 4.2 mmol/L (ref 3.5–5.1)
Sodium: 138 mmol/L (ref 135–145)
Total Bilirubin: 0.4 mg/dL (ref 0.3–1.2)
Total Protein: 6.2 g/dL — ABNORMAL LOW (ref 6.5–8.1)

## 2022-07-03 LAB — MAGNESIUM: Magnesium: 2.1 mg/dL (ref 1.7–2.4)

## 2022-07-03 MED ORDER — ZONISAMIDE 100 MG PO CAPS
200.0000 mg | ORAL_CAPSULE | Freq: Every day | ORAL | Status: DC
  Administered 2022-07-04 – 2022-07-05 (×2): 200 mg via ORAL
  Filled 2022-07-03 (×2): qty 2

## 2022-07-03 MED ORDER — LORAZEPAM 2 MG/ML IJ SOLN: 1.0000 mg | INTRAMUSCULAR | Status: DC | PRN

## 2022-07-03 MED ORDER — SODIUM CHLORIDE 0.9 % IV SOLN
INTRAVENOUS | Status: DC
Start: 2022-07-03 — End: 2022-07-05

## 2022-07-03 MED ORDER — SODIUM CHLORIDE 0.9 % IV SOLN
100.0000 mg | Freq: Two times a day (BID) | INTRAVENOUS | Status: DC
  Filled 2022-07-03 (×2): qty 10

## 2022-07-03 MED ORDER — ZONISAMIDE 100 MG PO CAPS
300.0000 mg | ORAL_CAPSULE | Freq: Every day | ORAL | Status: DC
  Administered 2022-07-03 – 2022-07-04 (×2): 300 mg via ORAL
  Filled 2022-07-03 (×3): qty 3

## 2022-07-03 MED ORDER — ONDANSETRON HCL 4 MG/2ML IJ SOLN: 4.0000 mg | Freq: Four times a day (QID) | INTRAMUSCULAR | Status: DC | PRN

## 2022-07-03 MED ORDER — SODIUM CHLORIDE 0.9 % IV SOLN: INTRAVENOUS | Status: DC

## 2022-07-03 MED ORDER — ONDANSETRON HCL 4 MG PO TABS: 4.0000 mg | ORAL_TABLET | Freq: Four times a day (QID) | ORAL | Status: DC | PRN

## 2022-07-03 MED ORDER — ONDANSETRON HCL 4 MG/2ML IJ SOLN
INTRAMUSCULAR | Status: AC
  Administered 2022-07-03: 4 mg via INTRAVENOUS
  Filled 2022-07-03: qty 2

## 2022-07-03 MED ORDER — SODIUM CHLORIDE 0.9 % IV SOLN
200.0000 mg | Freq: Once | INTRAVENOUS | Status: AC
  Administered 2022-07-03: 200 mg via INTRAVENOUS
  Filled 2022-07-03: qty 20

## 2022-07-03 MED ORDER — ONDANSETRON HCL 4 MG/2ML IJ SOLN: 4.0000 mg | Freq: Once | INTRAMUSCULAR | Status: AC

## 2022-07-03 MED ORDER — SODIUM CHLORIDE 0.9 % IV BOLUS
1000.0000 mL | Freq: Once | INTRAVENOUS | Status: AC
  Administered 2022-07-03: 1000 mL via INTRAVENOUS

## 2022-07-03 MED ORDER — SODIUM CHLORIDE 0.9 % IV SOLN
3500.0000 mg | Freq: Once | INTRAVENOUS | Status: AC
  Administered 2022-07-03: 3500 mg via INTRAVENOUS
  Filled 2022-07-03: qty 35

## 2022-07-03 MED ORDER — LACOSAMIDE 50 MG PO TABS
100.0000 mg | ORAL_TABLET | Freq: Two times a day (BID) | ORAL | Status: DC
  Administered 2022-07-03 – 2022-07-05 (×4): 100 mg via ORAL
  Filled 2022-07-03 (×4): qty 2

## 2022-07-03 MED ORDER — ALBUTEROL SULFATE (2.5 MG/3ML) 0.083% IN NEBU: 2.5000 mg | INHALATION_SOLUTION | RESPIRATORY_TRACT | Status: DC | PRN

## 2022-07-03 MED ORDER — LAMOTRIGINE 100 MG PO TABS
300.0000 mg | ORAL_TABLET | Freq: Two times a day (BID) | ORAL | Status: DC
  Administered 2022-07-03 – 2022-07-05 (×4): 300 mg via ORAL
  Filled 2022-07-03 (×3): qty 2
  Filled 2022-07-03: qty 3
  Filled 2022-07-03: qty 2
  Filled 2022-07-03: qty 3

## 2022-07-03 MED ORDER — LORAZEPAM 2 MG/ML IJ SOLN
INTRAMUSCULAR | Status: AC
  Administered 2022-07-03: 2 mg
  Filled 2022-07-03: qty 1

## 2022-07-03 NOTE — Consult Note (Signed)
NEUROLOGY CONSULTATION NOTE   Date of service: July 03, 2022 Patient Name: Alexis Woods MRN:  [id number] DOB:  [birth year] Reason for consult: "multiple seizures" Requesting Provider: Isla Pence, MD _ _ _   _ __   _ __ _ _  __ __   _ __   __ _  History of Present Illness  Alexis Woods is a 57 y.o. female with PMH significant for TBI, epilepsy on Lamictal and Zonegran who missed a dose of her meds last night and this AM and then found confused by daughter and had 2 seizures at home. Took her Zonegran and Lamictal and then called EMS. Had 2 additional seizures and EMS gave her Versed. She was post ictal and sedated on arrival and improved. Was in the bathroom in the ED and had another seizure and got Vimpat. Just after loading with Vimpat, she has another seizure lasting 1-2 mins and post ictal. She got 2 mg of Ativan.  Neurology consulted for multiple seizures and concern for clustering of seizures.  On my eval, patient is post ictal and unable to provide history. Husband and son at bedside. Report that patient was having seizures despite taking Keppra and was thus on Zonisamide and lamotrigine.   ROS   Unable to get detailed ROS 2/2 somnolenceand post ictal.  Past History   Past Medical History:  Diagnosis Date   Seizure ([place])    TBI (traumatic brain injury) ([place])    Past Surgical History:  Procedure Laterality Date   ABDOMINAL HYSTERECTOMY     CLEFT LIP REPAIR  1987   LUMBAR LAMINECTOMY/ DECOMPRESSION WITH MET-RX Right 06/19/2020   Procedure: Right Lumbar four-five Minimally invasive decompression and synovial cyst resection;  Surgeon: Judith Part, MD;  Location: [place];  Service: Neurosurgery;  Laterality: Right;   History reviewed. No pertinent family history. Social History   Socioeconomic History   Marital status: Married    Spouse name: Not on file   Number of children: Not on file   Years of education: Not on file   Highest education level: Not on file   Occupational History   Not on file  Tobacco Use   Smoking status: Never   Smokeless tobacco: Never  Vaping Use   Vaping Use: Never used  Substance and Sexual Activity   Alcohol use: Not Currently   Drug use: Not Currently   Sexual activity: Not on file  Other Topics Concern   Not on file  Social History Narrative   Not on file   Social Determinants of Health   Financial Resource Strain: Not on file  Food Insecurity: Not on file  Transportation Needs: Not on file  Physical Activity: Not on file  Stress: Not on file  Social Connections: Not on file   Allergies  Allergen Reactions   Codeine Nausea And Vomiting   Oxycodone Other (See Comments)    Mental Status Changes     Sertraline Nausea And Vomiting   Gluten Meal Swelling, Rash and Other (See Comments)    Top lip swells     Medications  (Not in a hospital admission)    Vitals   Vitals:   07/03/22 1915 07/03/22 1920 07/03/22 1930 07/03/22 1932  BP: 120/82  133/81   Pulse: 77  95 93  Resp: (!) 21  (!) 24 (!) 40  Temp:  97.6 F (36.4 C)    TempSrc:  Oral    SpO2: 99%  97% (S) (!) 89%  Weight:  Height:         Body mass index is 22.26 kg/m.  Physical Exam   General: Laying comfortably in bed; in no acute distress. HENT: Normal oropharynx and mucosa. Normal external appearance of ears and nose.  Neck: Supple, no pain or tenderness  CV: No JVD. No peripheral edema.  Pulmonary: Symmetric Chest rise. Normal respiratory effort.  Abdomen: Soft to touch, non-tender.  Ext: No cyanosis, edema, or deformity  Skin: No rash. Normal palpation of skin.   Musculoskeletal: Normal digits and nails by inspection. No clubbing.   Neurologic Examination  Mental status/Cognition: drowsy, somnolent, oriented to self. Keeps falling asleep, poor attention.  Speech/language: non fluent, comprehension intact to simple commands. Somnolence precludes detailed evaluation Cranial nerves:   CN II Pupils equal and reactive  to light, makes eye contact on left and right   CN III,IV,VI EOM intact, no gaze preference or deviation, no nystagmus   CN V normal sensation in V1, V2, and V3 segments bilaterally    CN VII no asymmetry, no nasolabial fold flattening    CN VIII Turns head towards speech   CN IX & X normal palatal elevation, no uvular deviation    CN XI 5/5 head turn and 5/5 shoulder shrug bilaterally    CN XII midline tongue protrusion   Motor:  Muscle bulk: normal, tone normal Moves all extremities spontaneously and antigravity to command.  Difficult to do detailed strength testing secondary to somnolence.  Handgrip bilaterally is at least a 4+ out of 5.  Sensation:  Light touch Grossly intact throughout   Pin prick    Temperature    Vibration   Proprioception    Coordination/Complex Motor:  Difficult to assess but movements are smooth without any obvious ataxia.   Labs   CBC:  Recent Labs  Lab 07/03/22 1549  WBC 5.6  NEUTROABS 3.7  HGB 13.4  HCT 42.4  MCV 94.2  PLT 856    Basic Metabolic Panel:  Lab Results  Component Value Date   NA 138 07/03/2022   K 4.2 07/03/2022   CO2 15 (L) 07/03/2022   GLUCOSE 97 07/03/2022   BUN 12 07/03/2022   CREATININE 1.20 (H) 07/03/2022   CALCIUM 8.8 (L) 07/03/2022   GFRNONAA 53 (L) 07/03/2022   GFRAA 59 (L) 07/11/2020   Lipid Panel: No results found for: "LDLCALC" HgbA1c: No results found for: "HGBA1C" Urine Drug Screen:     Component Value Date/Time   LABOPIA NONE DETECTED 03/29/2021 1300   COCAINSCRNUR NONE DETECTED 03/29/2021 1300   LABBENZ NONE DETECTED 03/29/2021 1300   AMPHETMU NONE DETECTED 03/29/2021 1300   THCU NONE DETECTED 03/29/2021 1300   LABBARB NONE DETECTED 03/29/2021 1300    Alcohol Level     Component Value Date/Time   ETH <10 05/01/2021 1239    CT Head without contrast(Personally reviewed): CTH was negative for a large hypodensity concerning for a large territory infarct or hyperdensity concerning for an  ICH  Impression   Alexis Woods is a 57 y.o. female with PMH significant for TBI, epilepsy on Lamictal and Zonegran who missed a dose of her meds last night and this AM and has had 6 seizures over the last 8-10 hours. Her neurologic examination is consistent with postictal somnolence and sedation expected after multiple seizures and benzo's and other seizure medications.  She has no focal deficit and seems to be waking up and follows one-step commands in all extremities.  No clinical seizure activity noted and given  the fact that she is following commands in all extremities, very low suspicion that she is having subclinical seizures.  Given seizure clustering, will make sure that she gets night dose of her zonisamide and lamotrigine here.  She was also loaded with Vimpat after her fourth seizure and again with Keppra after her sixth seizure.  Recommendations  - Continue home zonisamide and lamotrigine. Ordered based on the bottles of AEDs I saw in the room that the family had brought with them. - started on Vimpat 14m BID after loading her with Vimpat - also loaded with Keppra 35066m Hold off on maintenance Keppra. - Seizure precautions with seizure pads. - Ativan 1-37m36mRN for GTC seizure lasting more than 3 mins. - no driving for 6 months. This will need to be reiterated when she is more awake. Has to be seizure free for 6 months before she can resume driving. Also discussed other precautions with son at the bedside. ______________________________________________________________________   Thank you for the opportunity to take part in the care of this patient. If you have any further questions, please contact the neurology consultation attending.  Signed,  SalAmboyger Number [id number]_ _   _ __   _ __ _ _  __ __   _ __   __ _

## 2022-07-03 NOTE — ED Notes (Signed)
This NT assisted pt to the bathroom. As pt was sitting down pt began to stare off at the wall and starting having jerking motions and went limp in my arms. NT yelled out for help and the staff assist button was pressed. Pt lowered to floor without hitting head.

## 2022-07-03 NOTE — H&P (Addendum)
History and Physical    Alexis Woods VQM:[id number] DOB: 1965-11-05 DOA: 07/03/2022  PCP: Maris Berger, MD  Patient coming from: home  I have personally briefly reviewed patient's old medical records in [place]  Chief Complaint: recurrent breakthrough seizure  HPI: Alexis Woods is a 57 y.o. female with medical history significant of TBI, Seizure d/o  on Lamictal and Zonegran who presents to ED BIB EMS after having multiple breakthrough seizures at home after missing last pm dose of AED. Patient was noted per daughter to be confused this am and had 2 witnessed seizures. Patient was then able to take her seizure medications and EMS was called.  Per EMS enroute patient had 2 further episodes and was given versed.  Patient in ED post ictal, per family patient was her usual self prior to initial onset of seizure. Per family patient last seizure was close to 14 months ago .  Patient unable to participate in history due to post ictal / medicated state  ED Course: In ED Afeb,bp 104/85, hr 86, Hr 14 sat 99%  Labs  Wgc 5.6, hgb 13.4, plt 192,  N a 138, K 4.2, bicarb 15, cr 1.2, ca 8.8,  Mag 2.1  EKG: sinus rhythm  Cxr NAd Desat to 89 % s/p AED and ativan ,  recovered with O2 Tx ns 1L  vimpat 234m ,lorazepam 260mzofran keppra 3500x 1 Review of Systems: As per HPI otherwise 10 point review of systems negative.   Past Medical History:  Diagnosis Date   [facility]   [facility]    Past Surgical History:  Procedure Laterality Date   ABDOMINAL HYSTERECTOMY     CLEFT LIP REPAIR  1987   LUMBAR LAMINECTOMY/ DECOMPRESSION WITH MET-RX Right 06/19/2020   Procedure: Right Lumbar four-five Minimally invasive decompression and synovial cyst resection;  Surgeon: OsJudith PartMD;  [facility];  Laterality: Right;     reports that she has never smoked. She has never used smokeless tobacco. She reports that she does not currently  use alcohol. She reports that she does not currently use drugs.  Allergies  Allergen Reactions   Codeine Nausea And Vomiting   Oxycodone Other (See Comments)    Mental Status Changes     Sertraline Nausea And Vomiting   Gluten Meal Swelling, Rash and Other (See Comments)    Top lip swells     History reviewed. No pertinent family history.  Prior to Admission medications   Medication Sig Start Date End Date Taking? Authorizing Provider  acetaminophen (TYLENOL) 500 MG tablet Take 500 mg by mouth every 6 (six) hours as needed for mild pain or headache.    [provider]  bacitracin-polymyxin b (POLYSPORIN) ointment Apply 1 application topically daily as needed (wound care).    [provider]  donepezil (ARICEPT) 10 MG tablet Take 1 tablet (10 mg total) by mouth at bedtime. Please call and make overdue appt for further refills, 1st attempt 08/20/21   Dohmeier, CaAsencion PartridgeMD  gabapentin (NEURONTIN) 100 MG capsule Take 200 mg by mouth 3 (three) times daily.    [provider]  gabapentin (NEURONTIN) 400 MG capsule Take 1 capsule (400 mg total) by mouth 3 (three) times daily. Take 2003my mouth three times daily, total of 600m78mily Patient not taking: Reported on 05/01/2021 03/29/21 04/28/21  Horton, KrisAlvin Critchley  lamoTRIgine (LAMICTAL) 150 MG tablet Take 150 mg by mouth  2 (two) times daily. 04/15/21   [provider]  lamoTRIgine (LAMICTAL) 200 MG tablet Take 1 tablet (200 mg total) by mouth 2 (two) times daily. Patient not taking: Reported on 05/01/2021 12/03/20   Dohmeier, Asencion Partridge, MD  levETIRAcetam (KEPPRA) 750 MG tablet Take 1 tablet (750 mg total) by mouth 2 (two) times daily. 12/18/20   Dohmeier, Asencion Partridge, MD  Midazolam (NAYZILAM) 5 MG/0.1ML SOLN Use for protracted seizure, 1 spray in one nostril Patient taking differently: Place into the nose See admin instructions. Instill 1 spray AS DIRECTED into one nostril "for a protracted seizure" 12/10/20   Dohmeier,  Asencion Partridge, MD  Multiple Vitamin (MULTIVITAMIN) tablet Take 1 tablet by mouth daily.    [provider]  Omega 3-6-9 Fatty Acids (OMEGA-3-6-9 PO) Take 1 capsule by mouth daily.    [provider]  OVER THE COUNTER MEDICATION Take 750 mg by mouth See admin instructions. CuraMed 750 mg softgels- Take 1 softgel by mouth once a day    [provider]  sodium chloride (OCEAN) 0.65 % SOLN nasal spray Place 1 spray into both nostrils as needed for congestion.    [provider]    Physical Exam: Vitals:   07/03/22 2045 07/03/22 2100 07/03/22 2115 07/03/22 2135  BP: 101/70 1_0  Pulse: 69 67 67 79  Resp: _1 Temp:      TempSrc:      SpO2: 100% 100% 100% 99%  Weight:      Height:        Constitutional: NAD, calm, comfortable post ictal Vitals:   07/03/22 2045 07/03/22 2100 07/03/22 2115 07/03/22 2135  BP: 101/70 1_2  Pulse: 69 67 67 79  Resp: _3 Temp:      TempSrc:      SpO2: 100% 100% 100% 99%  Weight:      Height:       Eyes: PERRL, lids and conjunctivae normal ENMT: Mucous membranes are moist. Posterior pharynx clear of any exudate or lesions.Normal dentition.  Neck: normal, supple, no masses, no thyromegaly Respiratory: clear to auscultation bilaterally, no wheezing, no crackles. Normal respiratory effort. No accessory muscle use.  Cardiovascular: Regular rate and rhythm, no murmurs / rubs / gallops. No extremity edema. 2+ pedal pulses.  Abdomen: no tenderness, no masses palpated. No hepatosplenomegaly. Bowel sounds positive.  Musculoskeletal: no clubbing / cyanosis. No joint deformity upper and lower extremities. Good ROM, no contractures. Normal muscle tone.  Skin: no rashes, lesions, ulcers. No induration Neurologic: CN 2-12 grossly intact. Sensation intact, . Strength 5/5 in all 4.  Psychiatric: lethargic / post itcal unable to assess orientation    Labs on Admission: I have personally reviewed  following labs and imaging studies  CBC: Recent Labs  Lab 07/03/22 1549  WBC 5.6  NEUTROABS 3.7  HGB 13.4  HCT 42.4  MCV 94.2  PLT 916   Basic Metabolic Panel: Recent Labs  Lab 07/03/22 1549  NA 138  K 4.2  CL 108  CO2 15*  GLUCOSE 97  BUN 12  CREATININE 1.20*  CALCIUM 8.8*  MG 2.1   GFR: Estimated Creatinine Clearance: 50.6 mL/min (A) (by C-G formula based on SCr of 1.2 mg/dL (H)). Liver Function Tests: Recent Labs  Lab 07/03/22 1549  AST 25  ALT 12  ALKPHOS 62  BILITOT 0.4  PROT 6.2*  ALBUMIN 3.9   No results for input(s): "LIPASE", "AMYLASE" in the last 168 hours. No results  for input(s): "AMMONIA" in the last 168 hours. Coagulation Profile: No results for input(s): "INR", "PROTIME" in the last 168 hours. Cardiac Enzymes: No results for input(s): "CKTOTAL", "CKMB", "CKMBINDEX", "TROPONINI" in the last 168 hours. BNP (last 3 results) No results for input(s): "PROBNP" in the last 8760 hours. HbA1C: No results for input(s): "HGBA1C" in the last 72 hours. CBG: Recent Labs  Lab 07/03/22 1600 07/03/22 1728  GLUCAP 105* 102*   Lipid Profile: No results for input(s): "CHOL", "HDL", "LDLCALC", "TRIG", "CHOLHDL", "LDLDIRECT" in the last 72 hours. Thyroid Function Tests: No results for input(s): "TSH", "T4TOTAL", "FREET4", "T3FREE", "THYROIDAB" in the last 72 hours. Anemia Panel: No results for input(s): "VITAMINB12", "FOLATE", "FERRITIN", "TIBC", "IRON", "RETICCTPCT" in the last 72 hours. Urine analysis:    Component Value Date/Time   COLORURINE STRAW (A) 05/01/2021 1452   APPEARANCEUR CLEAR 05/01/2021 1452   LABSPEC 1.004 (L) 05/01/2021 1452   PHURINE 7.0 05/01/2021 1452   GLUCOSEU NEGATIVE 05/01/2021 1452   HGBUR SMALL (A) 05/01/2021 1452   BILIRUBINUR NEGATIVE 05/01/2021 1452   KETONESUR NEGATIVE 05/01/2021 1452   PROTEINUR NEGATIVE 05/01/2021 1452   NITRITE NEGATIVE 05/01/2021 1452   LEUKOCYTESUR MODERATE (A) 05/01/2021 1452    Radiological  Exams on Admission: DG Chest Portable 1 View  Result Date: 07/03/2022 CLINICAL DATA:  Status post seizure. EXAM: PORTABLE CHEST 1 VIEW COMPARISON:  July 02, 2020 FINDINGS: The heart size and mediastinal contours are within normal limits. Both lungs are clear. The visualized skeletal structures are unremarkable. IMPRESSION: No active disease. Electronically Signed   By: Virgina Norfolk M.D.   On: 07/03/2022 20:47    EKG: Independently reviewed. See above Assessment/Plan   Seizure d/o with recurrent breakthrough seizures  -electrolytes stable , no signe of infection  -critical care Dr Lamonte Sakai notes no current critical care needs at this time -s/p vimpat/ativan/ keppra in ED -admit to progressive care  - patient with desat to 89% s/p AED/ativan but noted recovery with O2 -patient currently maintaining airway  -patient evaluated by neuro recs as noted below -started on Vimpat 134m BID  -s/p keppra load , hold on maintenance currenlty  -Seizure precautions  -Ativan 1-226mPRN for GTC seizure lasting more than 3 mins.    Hypotension -possible volume /med side-effect  -s/p 3Lns and albumin  -map 6048-patient is perfusing making good urine and is  has stable mental status with clearing of her post ictal state  - no signs of bleeding or infection , likely dehydration or med -side-effect  -repeat h/h , EKG /tCleora Fleetecho pending  -continue with ivfs      DVT prophylaxis: scd Code Status: full/ as discussed per patient wishes in event of cardiac arrest  Family Communication:  Disposition Plan: patient  expected to be admitted greater than 2 midnights d) Consults called: Neurology Dr KhLorrin Goodelldmission status: progressive care    SaClance BollD [facility]   If 7PM-7AM, please contact night-coverage [email or web address] Password [email or web address]1/09/2022, 10:31 PM

## 2022-07-03 NOTE — ED Triage Notes (Signed)
Pt BIB [facility] coming from home. Pt has had multiple sz today. Pt reportedly missed her medication last night and this AM, but took a dose PTA of EMS. EMS admin 5mg  Versed IM at 1455.Pt is alert with some disorientation on arrival.   EMS V/S:  124/70 HR 88 SpO2 99% on R/A CBG 80

## 2022-07-03 NOTE — ED Notes (Signed)
Pt's husband called out - Mrs.  Iannaccone was having seizure like behavior.  Dr Gilford Raid aware Ativan ordered

## 2022-07-03 NOTE — ED Provider Notes (Signed)
[facility] EMERGENCY DEPARTMENT Provider Note   CSN: [id number] Arrival date & time: 07/03/22  1541     History  Chief Complaint  Patient presents with   Seizures    Alexis Woods is a 57 y.o. female.  Pt is a 57 yo female with a pmhx significant for seizures and TBI.  She follows at the [facility] epilepsy clinic.  Pt is supposed to take lamictal and zonegran for her seizures.  She told EMS she missed her dose last night and this am.  Pt lives alone and was found by her daughter confused.  EMS was called and she had 2 short seizures en route.  She was given 5 mg versed IM en route.  EMS said pt did take her oral dose prior to EMS arrival.  Pt denies any pain.          Home Medications Prior to Admission medications   Medication Sig Start Date End Date Taking? Authorizing Provider  acetaminophen (TYLENOL) 500 MG tablet Take 500 mg by mouth every 6 (six) hours as needed for mild pain or headache.    [provider]  bacitracin-polymyxin b (POLYSPORIN) ointment Apply 1 application topically daily as needed (wound care).    [provider]  donepezil (ARICEPT) 10 MG tablet Take 1 tablet (10 mg total) by mouth at bedtime. Please call and make overdue appt for further refills, 1st attempt 08/20/21   Dohmeier, Asencion Partridge, MD  gabapentin (NEURONTIN) 100 MG capsule Take 200 mg by mouth 3 (three) times daily.    [provider]  gabapentin (NEURONTIN) 400 MG capsule Take 1 capsule (400 mg total) by mouth 3 (three) times daily. Take 200mg  by mouth three times daily, total of 600mg  daily Patient not taking: Reported on 05/01/2021 03/29/21 04/28/21  Horton, Alvin Critchley, DO  lamoTRIgine (LAMICTAL) 150 MG tablet Take 150 mg by mouth 2 (two) times daily. 04/15/21   [provider]  lamoTRIgine (LAMICTAL) 200 MG tablet Take 1 tablet (200 mg total) by mouth 2 (two) times daily. Patient not taking: Reported on 05/01/2021 12/03/20   Dohmeier, Asencion Partridge, MD   levETIRAcetam (KEPPRA) 750 MG tablet Take 1 tablet (750 mg total) by mouth 2 (two) times daily. 12/18/20   Dohmeier, Asencion Partridge, MD  Midazolam (NAYZILAM) 5 MG/0.1ML SOLN Use for protracted seizure, 1 spray in one nostril Patient taking differently: Place into the nose See admin instructions. Instill 1 spray AS DIRECTED into one nostril "for a protracted seizure" 12/10/20   Dohmeier, Asencion Partridge, MD  Multiple Vitamin (MULTIVITAMIN) tablet Take 1 tablet by mouth daily.    [provider]  Omega 3-6-9 Fatty Acids (OMEGA-3-6-9 PO) Take 1 capsule by mouth daily.    [provider]  OVER THE COUNTER MEDICATION Take 750 mg by mouth See admin instructions. CuraMed 750 mg softgels- Take 1 softgel by mouth once a day    [provider]  sodium chloride (OCEAN) 0.65 % SOLN nasal spray Place 1 spray into both nostrils as needed for congestion.    [provider]      Allergies    Codeine, Oxycodone, Sertraline, and Gluten meal    Review of Systems   Review of Systems  Neurological:  Positive for seizures.  All other systems reviewed and are negative.   Physical Exam Updated Vital Signs BP 99/76   Pulse 79   Temp 97.6 F (36.4 C) (Oral)   Resp 17   Ht 5' 6.5" (1.689 m)   [employer]  63.5 kg   SpO2 99%   BMI 22.26 kg/m  Physical Exam Vitals and nursing note reviewed.  Constitutional:      Appearance: Normal appearance.  HENT:     Head: Normocephalic and atraumatic.     Right Ear: External ear normal.     Left Ear: External ear normal.     Nose: Nose normal.     Mouth/Throat:     Mouth: Mucous membranes are moist.     Pharynx: Oropharynx is clear.  Eyes:     Extraocular Movements: Extraocular movements intact.     Conjunctiva/sclera: Conjunctivae normal.     Pupils: Pupils are equal, round, and reactive to light.  Cardiovascular:     Rate and Rhythm: Normal rate and regular rhythm.     Pulses: Normal pulses.     Heart sounds: Normal heart sounds.  Pulmonary:      Effort: Pulmonary effort is normal.     Breath sounds: Normal breath sounds.  Abdominal:     General: Abdomen is flat. Bowel sounds are normal.     Palpations: Abdomen is soft.  Musculoskeletal:        General: Normal range of motion.     Cervical back: Normal range of motion and neck supple.  Skin:    General: Skin is warm.     Capillary Refill: Capillary refill takes less than 2 seconds.  Neurological:     General: No focal deficit present.     Mental Status: She is alert.     Comments: Pt is awake, but she is still slow to respond.    Psychiatric:        Mood and Affect: Mood normal.        Behavior: Behavior normal.     ED Results / Procedures / Treatments   Labs (all labs ordered are listed, but only abnormal results are displayed) Labs Reviewed  COMPREHENSIVE METABOLIC PANEL - Abnormal; Notable for the following components:      Result Value   CO2 15 (*)    Creatinine, Ser 1.20 (*)    Calcium 8.8 (*)    Total Protein 6.2 (*)    GFR, Estimated 53 (*)    All other components within normal limits  CBG MONITORING, ED - Abnormal; Notable for the following components:   Glucose-Capillary 105 (*)    All other components within normal limits  CBG MONITORING, ED - Abnormal; Notable for the following components:   Glucose-Capillary 102 (*)    All other components within normal limits  CBC WITH DIFFERENTIAL/PLATELET  MAGNESIUM  URINALYSIS, ROUTINE W REFLEX MICROSCOPIC  C-REACTIVE PROTEIN    EKG EKG Interpretation  Date/Time:  Thursday July 03 2022 15:53:30 EST Ventricular Rate:  82 PR Interval:  137 QRS Duration: 98 QT Interval:  387 QTC Calculation: 452 R Axis:   34 Text Interpretation: Sinus rhythm Low voltage, precordial leads RSR' in V1 or V2, right VCD or RVH Borderline T abnormalities, anterior leads No significant change since last tracing Confirmed by Jacalyn Lefevre [phone]) on 07/03/2022 4:17:52 PM  Radiology DG Chest Portable 1 View  Result Date:  07/03/2022 CLINICAL DATA:  Status post seizure. EXAM: PORTABLE CHEST 1 VIEW COMPARISON:  July 02, 2020 FINDINGS: The heart size and mediastinal contours are within normal limits. Both lungs are clear. The visualized skeletal structures are unremarkable. IMPRESSION: No active disease. Electronically Signed   By: Aram Candela M.D.   On: 07/03/2022 20:47    Procedures Procedures  Medications Ordered in ED Medications  sodium chloride 0.9 % bolus 1,000 mL (0 mLs Intravenous Stopped 07/03/22 1653)    And  0.9 %  sodium chloride infusion ( Intravenous New Bag/Given 07/03/22 1654)  lamoTRIgine (LAMICTAL) tablet 300 mg (has no administration in time range)  zonisamide (ZONEGRAN) capsule 300 mg (has no administration in time range)    And  zonisamide (ZONEGRAN) capsule 200 mg (has no administration in time range)  lacosamide (VIMPAT) 100 mg in sodium chloride 0.9 % 25 mL IVPB (has no administration in time range)    Or  lacosamide (VIMPAT) tablet 100 mg (has no administration in time range)  LORazepam (ATIVAN) injection 1-2 mg (has no administration in time range)  lacosamide (VIMPAT) 200 mg in sodium chloride 0.9 % 25 mL IVPB (0 mg Intravenous Stopped 07/03/22 1919)  LORazepam (ATIVAN) 2 MG/ML injection (2 mg  Given 07/03/22 1927)  levETIRAcetam (KEPPRA) 3,500 mg in sodium chloride 0.9 % 250 mL IVPB (0 mg Intravenous Stopped 07/03/22 2050)  ondansetron (ZOFRAN) injection 4 mg (4 mg Intravenous Given 07/03/22 2012)    ED Course/ Medical Decision Making/ A&P                           Medical Decision Making Amount and/or Complexity of Data Reviewed Labs: ordered. Radiology: ordered.  Risk Prescription drug management. Decision regarding hospitalization.   This patient presents to the ED for concern of seizure, this involves an extensive number of treatment options, and is a complaint that carries with it a high risk of complications and morbidity.  The differential diagnosis includes  medication noncompliance, breakthrough, electrolyte abn, infection   Co morbidities that complicate the patient evaluation  Seizures and tbo   Additional history obtained:  Additional history obtained from epic chart review External records from outside source obtained and reviewed including EMS report   Lab Tests:  I Ordered, and personally interpreted labs.  The pertinent results include:  cbc is nl, cmp with co2 15 (likely from seizure); cr 1.2 (chronic); mg low at 2.1   Imaging Studies ordered:  I ordered imaging studies including cxr  I independently visualized and interpreted imaging which showed No active disease.  I agree with the radiologist interpretation   Cardiac Monitoring:  The patient was maintained on a cardiac monitor.  I personally viewed and interpreted the cardiac monitored which showed an underlying rhythm of: nsr   Medicines ordered and prescription drug management:  I ordered medication including ivfs  for dehydration; vimpat for seizure  Reevaluation of the patient after these medicines showed that the patient improved I have reviewed the patients home medicines and have made adjustments as needed   Test Considered:  Ct, but pt admits missing doses   Critical Interventions:  vimpat   Consultations Obtained:  I requested consultation with the neurologist (Dr. Iver Nestle),  and discussed lab and imaging findings as well as pertinent plan -  she recommends vimpat as keppra did not help seizures and caused psychological problems.  Dr. Derry Lory saw pt in consult as well. Pt d/w Dr. Maisie Fus ([facility]) who asked that I speak with CCM as she's had several seizures.  I did speak with Dr. Delton Coombes.  He recommends non icu admission as she is currently protecting her airway.  They are here all night if anything worsens.   Problem List / ED Course:  Seizure:  Pt was taken to the bathroom by the tech.  While in the  bathroom.  She became limp and started having  a seizure.  The tech did not let the patient fall and she eased her to the ground.  Pt's seizure stopped without intervention.  She is given vimpat IV.  Shortly after vimpat, pt had another seizure which lasted 1-2 minutes.  She did require 2 mg ativan IV to stop this seizure.  Afterwards, I spoke with Dr. Lorrin Goodell (neuro) who recommends IV keppra and he will see her in consult.   Reevaluation:  After the interventions noted above, I reevaluated the patient and found that they have :improved   Social Determinants of Health:  Lives at home   Dispostion:  After consideration of the diagnostic results and the patients response to treatment, I feel that the patent would benefit from admission.    CRITICAL CARE Performed by: Isla Pence   Total critical care time: 30 minutes  Critical care time was exclusive of separately billable procedures and treating other patients.  Critical care was necessary to treat or prevent imminent or life-threatening deterioration.  Critical care was time spent personally by me on the following activities: development of treatment plan with patient and/or surrogate as well as nursing, discussions with consultants, evaluation of patient's response to treatment, examination of patient, obtaining history from patient or surrogate, ordering and performing treatments and interventions, ordering and review of laboratory studies, ordering and review of radiographic studies, pulse oximetry and re-evaluation of patient's condition.         Final Clinical Impression(s) / ED Diagnoses Final diagnoses:  Seizure [facility])    Rx / [place] Orders ED Discharge Orders     None         Isla Pence, MD 07/03/22 2235

## 2022-07-03 NOTE — ED Notes (Addendum)
EMT took pt to bathroom (was was alert and walking on own at this time), per EMT once pt sat on toilet her eyes glazed over and she started jerking motions. This RN heard EMT call her name. Upon arrival, pt's lips were blue. This RN and EMT lowered pt to floor, pt did not hit head. Staff assist button pressed and pt placed on NRB 15 lpm. Pt became more alert and had spontaneous breaths. Pt was transfer from floor to backboard and back onto gurney.

## 2022-07-04 ENCOUNTER — Inpatient Hospital Stay (HOSPITAL_COMMUNITY): Payer: Self-pay

## 2022-07-04 DIAGNOSIS — S069X2S Unspecified intracranial injury with loss of consciousness of 31 minutes to 59 minutes, sequela: Secondary | ICD-10-CM

## 2022-07-04 DIAGNOSIS — I959 Hypotension, unspecified: Secondary | ICD-10-CM | POA: Diagnosis present

## 2022-07-04 DIAGNOSIS — J9601 Acute respiratory failure with hypoxia: Secondary | ICD-10-CM

## 2022-07-04 DIAGNOSIS — R008 Other abnormalities of heart beat: Secondary | ICD-10-CM

## 2022-07-04 DIAGNOSIS — I9589 Other hypotension: Secondary | ICD-10-CM

## 2022-07-04 DIAGNOSIS — E861 Hypovolemia: Secondary | ICD-10-CM

## 2022-07-04 LAB — GLUCOSE, CAPILLARY
Glucose-Capillary: 87 mg/dL (ref 70–99)
Glucose-Capillary: 106 mg/dL — ABNORMAL HIGH (ref 70–99)

## 2022-07-04 LAB — I-STAT VENOUS BLOOD GAS, ED
Acid-base deficit: 5 mmol/L — ABNORMAL HIGH (ref 0.0–2.0)
Bicarbonate: 19.4 mmol/L — ABNORMAL LOW (ref 20.0–28.0)
Calcium, Ion: 1.12 mmol/L — ABNORMAL LOW (ref 1.15–1.40)
HCT: 39 % (ref 36.0–46.0)
Hemoglobin: 13.3 g/dL (ref 12.0–15.0)
O2 Saturation: 82 %
Potassium: 5 mmol/L (ref 3.5–5.1)
Sodium: 137 mmol/L (ref 135–145)
TCO2: 20 mmol/L — ABNORMAL LOW (ref 22–32)
pCO2, Ven: 33.4 mmHg — ABNORMAL LOW (ref 44–60)
pH, Ven: 7.371 (ref 7.25–7.43)
pO2, Ven: 47 mmHg — ABNORMAL HIGH (ref 32–45)

## 2022-07-04 LAB — RAPID URINE DRUG SCREEN, HOSP PERFORMED
Amphetamines: NOT DETECTED
Barbiturates: NOT DETECTED
Benzodiazepines: POSITIVE — AB
Cocaine: NOT DETECTED
Opiates: NOT DETECTED
Tetrahydrocannabinol: NOT DETECTED

## 2022-07-04 LAB — ECHOCARDIOGRAM COMPLETE
Area-P 1/2: 2.24 cm2
Height: 66.5 in
MV M vel: 1.01 m/s
MV Peak grad: 4.1 mmHg
S' Lateral: 2.2 cm
Weight: 2240 oz

## 2022-07-04 LAB — BLOOD GAS, VENOUS
Acid-base deficit: 4.1 mmol/L — ABNORMAL HIGH (ref 0.0–2.0)
Bicarbonate: 22.2 mmol/L (ref 20.0–28.0)
Drawn by: 164
O2 Saturation: 64.5 %
Patient temperature: 37
pCO2, Ven: 44 mmHg (ref 44–60)
pH, Ven: 7.31 (ref 7.25–7.43)
pO2, Ven: 35 mmHg (ref 32–45)

## 2022-07-04 LAB — CBC
HCT: 32 % — ABNORMAL LOW (ref 36.0–46.0)
Hemoglobin: 11 g/dL — ABNORMAL LOW (ref 12.0–15.0)
MCH: 30.5 pg (ref 26.0–34.0)
MCHC: 34.4 g/dL (ref 30.0–36.0)
MCV: 88.6 fL (ref 80.0–100.0)
Platelets: 192 10*3/uL (ref 150–400)
RBC: 3.61 MIL/uL — ABNORMAL LOW (ref 3.87–5.11)
RDW: 12.8 % (ref 11.5–15.5)
WBC: 6.8 10*3/uL (ref 4.0–10.5)
nRBC: 0 % (ref 0.0–0.2)

## 2022-07-04 LAB — LACTIC ACID, PLASMA
Lactic Acid, Venous: 1 mmol/L (ref 0.5–1.9)
Lactic Acid, Venous: 0.8 mmol/L (ref 0.5–1.9)
Lactic Acid, Venous: 2.5 mmol/L (ref 0.5–1.9)
Lactic Acid, Venous: 2.2 mmol/L (ref 0.5–1.9)

## 2022-07-04 LAB — BASIC METABOLIC PANEL
Anion gap: 5 (ref 5–15)
BUN: 6 mg/dL (ref 6–20)
CO2: 19 mmol/L — ABNORMAL LOW (ref 22–32)
Calcium: 8 mg/dL — ABNORMAL LOW (ref 8.9–10.3)
Chloride: 112 mmol/L — ABNORMAL HIGH (ref 98–111)
Creatinine, Ser: 0.94 mg/dL (ref 0.44–1.00)
GFR, Estimated: >60 mL/min (ref >60)
Glucose, Bld: 94 mg/dL (ref 70–99)
Potassium: 4 mmol/L (ref 3.5–5.1)
Sodium: 136 mmol/L (ref 135–145)

## 2022-07-04 LAB — CBG MONITORING, ED: Glucose-Capillary: 81 mg/dL (ref 70–99)

## 2022-07-04 LAB — URINALYSIS, ROUTINE W REFLEX MICROSCOPIC
Bacteria, UA: NONE SEEN
Bilirubin Urine: NEGATIVE
Glucose, UA: NEGATIVE mg/dL
Ketones, ur: 5 mg/dL — AB
Nitrite: NEGATIVE
Protein, ur: NEGATIVE mg/dL
Specific Gravity, Urine: 1.012 (ref 1.005–1.030)
pH: 7 (ref 5.0–8.0)

## 2022-07-04 LAB — ETHANOL: Alcohol, Ethyl (B): <10 mg/dL (ref <10)

## 2022-07-04 LAB — C-REACTIVE PROTEIN: CRP: <0.5 mg/dL (ref <1.0)

## 2022-07-04 LAB — TROPONIN I (HIGH SENSITIVITY)
Troponin I (High Sensitivity): 8 ng/L (ref <18)
Troponin I (High Sensitivity): 4 ng/L (ref <18)

## 2022-07-04 LAB — HIV ANTIBODY (ROUTINE TESTING W REFLEX): HIV Screen 4th Generation wRfx: NONREACTIVE

## 2022-07-04 LAB — PROCALCITONIN: Procalcitonin: <0.1 ng/mL

## 2022-07-04 MED ORDER — SODIUM CHLORIDE 0.9 % BOLUS PEDS
1000.0000 mL | Freq: Once | INTRAVENOUS | Status: AC
  Administered 2022-07-04: 1000 mL via INTRAVENOUS

## 2022-07-04 MED ORDER — SODIUM CHLORIDE 0.9 % IV BOLUS
1000.0000 mL | Freq: Once | INTRAVENOUS | Status: AC
  Administered 2022-07-04: 1000 mL via INTRAVENOUS

## 2022-07-04 MED ORDER — KETOROLAC TROMETHAMINE 30 MG/ML IJ SOLN
30.0000 mg | Freq: Once | INTRAMUSCULAR | Status: AC
  Administered 2022-07-04: 30 mg via INTRAVENOUS
  Filled 2022-07-04: qty 1

## 2022-07-04 MED ORDER — SODIUM CHLORIDE 0.9 % IV SOLN: INTRAVENOUS | Status: DC

## 2022-07-04 MED ORDER — DOPAMINE-DEXTROSE 3.2-5 MG/ML-% IV SOLN: 0.0000 ug/kg/min | INTRAVENOUS | Status: DC

## 2022-07-04 MED ORDER — ALBUMIN HUMAN 5 % IV SOLN
25.0000 g | Freq: Once | INTRAVENOUS | Status: AC
  Administered 2022-07-04: 25 g via INTRAVENOUS
  Filled 2022-07-04: qty 500

## 2022-07-04 MED ORDER — ALBUMIN HUMAN 25 % IV SOLN
12.5000 g | Freq: Once | INTRAVENOUS | Status: AC
  Administered 2022-07-04: 12.5 g via INTRAVENOUS
  Filled 2022-07-04: qty 50

## 2022-07-04 NOTE — Progress Notes (Signed)
[facility]                                                                              [name], is a 57 y.o. female, DOB - 1965/09/22, JAS:[id number] Admit date - 07/03/2022    Outpatient Primary MD for the patient is Maris Berger, MD  LOS - 1  days  Chief Complaint  Patient presents with   Seizures       Brief summary   Patient is a Alexis Woods with history of TBI, seizure disorder on Lamictal and Zonegran who presented via EMS after having multiple breakthrough seizures at home after missing last pm dose of seizures meds. Patient was noted per daughter to be confused on am of admission and had 2 witnessed seizures. Patient was then able to take her seizure medications and EMS was called.  Per EMS enroute patient had 2 further episodes and was given versed. In ED, patient was post ictal. Per family, patient was her usual self prior to initial onset of seizure. Per family, patient last seizure was close to 14 months ago. Neurology was consulted and admitted for further management   Assessment & Plan    Principal Problem:   Seizure disorder [facility]) with recurrent breakthrough seizures - on zonegran and lamictal at home - neurology was consulted, was given vimpat load, however another seizure post loading dose   - started on Vimpat 100mg  BID after loading dose of vimpat - also given keppra loading dose but trecommended to hold off on maintenance keppra  - continue seizure precautions   - afebrile, no infectious s/s, UA negative. CT head negative       Active Problems: Hypotension - BP still borderline low but now alert and oriented  - continue NS IV fluids  - if no significant improvement, will add midodrine     Acute respiratory failure with hypoxia - O2 sats 100% on 2L, wean O2 as tolerated     TBI (traumatic brain injury) ([place]) - as #1   Code Status: full code  DVT Prophylaxis:  SCDs Start: 07/03/22 2254   Level of Care: Level of care:  Progressive Family Communication: Updated patient's husband at bed side    Disposition Plan:      Remains inpatient appropriate:  multiple seizures in ED, will observe overnight, hopefully DC in 24hrs if no further seizures   Procedures:  None   Consultants:   Neurology   Antimicrobials:   Medications  lacosamide  100 mg Oral BID   lamoTRIgine  300 mg Oral BID   zonisamide  300 mg Oral QHS   And   zonisamide  200 mg Oral Daily      Subjective:   [name] was seen and examined today.  Alert and oriented, has headache but feels better.  Patient denies dizziness, chest pain, shortness of breath, abdominal pain, N/V. Husband at bed side,   Objective:   Vitals:   07/04/22 0545 07/04/22 0600 07/04/22 0615 07/04/22 0704  BP: (!) 82/52 (!) 96/33 (!) 93/Alexis (!) 85/56  Pulse: 70 63 65 79  Resp: 15 17 13 15   Temp:  TempSrc:      SpO2: 96% 98% 100% 98%  Weight:      Height:        Intake/Output Summary (Last 24 hours) at 07/04/2022 0752 Last data filed at 07/04/2022 0429 Gross per 24 hour  Intake 3019.54 ml  Output --  Net 3019.54 ml     Wt Readings from Last 3 Encounters:  07/03/22 63.5 kg  03/29/21 68 kg  12/04/20 63.5 kg     Exam General: Alert and oriented x 3, NAD Cardiovascular: S1 S2 auscultated,  RRR Respiratory: Clear to auscultation bilaterally, no wheezing Gastrointestinal: Soft, nontender, nondistended, + bowel sounds Ext: no pedal edema bilaterally Neuro: Strength 5/5 upper and lower extremities b/l Psych: Normal affect    Data Reviewed:  I have personally reviewed following labs    CBC Lab Results  Component Value Date   WBC 6.8 07/04/2022   RBC 3.61 (L) 07/04/2022   HGB 11.0 (L) 07/04/2022   HCT 32.0 (L) 07/04/2022   MCV 88.6 07/04/2022   MCH 30.5 07/04/2022   PLT 192 07/04/2022   MCHC 34.4 07/04/2022   RDW 12.8 07/04/2022   LYMPHSABS 1.6 07/03/2022   MONOABS 0.3 07/03/2022   EOSABS 0.0 07/03/2022   BASOSABS 0.1 07/03/2022      Last metabolic panel Lab Results  Component Value Date   NA 136 07/04/2022   K 4.0 07/04/2022   CL 112 (H) 07/04/2022   CO2 19 (L) 07/04/2022   BUN 6 07/04/2022   CREATININE 0.94 07/04/2022   GLUCOSE 94 07/04/2022   GFRNONAA >60 07/04/2022   GFRAA 59 (L) 07/11/2020   CALCIUM 8.0 (L) 07/04/2022   PROT 6.2 (L) 07/03/2022   ALBUMIN 3.9 07/03/2022   LABGLOB 2.5 07/11/2020   AGRATIO 1.8 07/11/2020   BILITOT 0.4 07/03/2022   ALKPHOS 62 07/03/2022   AST 25 07/03/2022   ALT 12 07/03/2022   ANIONGAP 5 07/04/2022    CBG (last 3)  Recent Labs    07/03/22 1600 07/03/22 1728  GLUCAP 105* 102*      Coagulation Profile: No results for input(s): "INR", "PROTIME" in the last 168 hours.   Radiology Studies: I have personally reviewed the imaging studies  CT HEAD WO CONTRAST  Result Date: 07/04/2022 CLINICAL DATA:  Head trauma, abnormal mental status. EXAM: CT HEAD WITHOUT CONTRAST TECHNIQUE: Contiguous axial images were obtained from the base of the skull through the vertex without intravenous contrast. RADIATION DOSE REDUCTION: This exam was performed according to the departmental dose-optimization program which includes automated exposure control, adjustment of the mA and/or kV according to patient size and/or use of iterative reconstruction technique. COMPARISON:  03/29/2021. FINDINGS: Brain: No acute intracranial hemorrhage, midline shift or mass effect. No extra-axial fluid collection. Gray-white matter differentiation is within normal limits. No hydrocephalus. There is a stable hypodensity in the basal ganglia on the left, compatible with known prominent perivascular space. Vascular: No hyperdense vessel or unexpected calcification. Skull: Normal. Negative for fracture or focal lesion. Sinuses/Orbits: No acute finding. Other: None. IMPRESSION: No acute intracranial process. Electronically Signed   By: Brett Fairy M.D.   On: 07/04/2022 00:00   DG Chest Portable 1 View  Result  Date: 07/03/2022 CLINICAL DATA:  Status post seizure. EXAM: PORTABLE CHEST 1 VIEW COMPARISON:  July 02, 2020 FINDINGS: The heart size and mediastinal contours are within normal limits. Both lungs are clear. The visualized skeletal structures are unremarkable. IMPRESSION: No active disease. Electronically Signed   By: Joyce Gross.D.  On: 07/03/2022 20:47       Zyshonne Malecha M.D. [facility] 07/04/2022, 7:52 AM  Available via Epic secure chat 7am-7pm After 7 pm, please refer to night coverage provider listed on amion.

## 2022-07-04 NOTE — Plan of Care (Signed)

## 2022-07-04 NOTE — ED Notes (Signed)
Help get patient cleaned up placed new sheets placed a pad and another brief patient is resting with call bell in reach and family at bedside

## 2022-07-04 NOTE — ED Notes (Signed)
ED TO INPATIENT HANDOFF REPORT  ED Nurse Name and Phone #: [phone]  S Name/Age/Gender Alexis Woods 57 y.o. female Room/Bed: 020C/020C  Code Status   Code Status: Full Code  Home/SNF/Other Home Patient oriented to: self, place, time, and situation Is this baseline? Yes   Triage Complete: Triage complete  Chief Complaint Seizure disorder [facility](HCC) [G40.909]  Triage Note Pt BIB [facility] coming from home. Pt has had multiple sz today. Pt reportedly missed her medication last night and this AM, but took a dose PTA of EMS. EMS admin 5mg  Versed IM at 1455.Pt is alert with some disorientation on arrival.   EMS V/S:  124/70 HR 88 SpO2 99% on R/A CBG 80   Allergies Allergies  Allergen Reactions   Codeine Nausea And Vomiting   Oxycodone Other (See Comments)    Mental Status Changes     Sertraline Nausea And Vomiting   Gluten Meal Swelling, Rash and Other (See Comments)    Top lip swells     Level of Care/Admitting Diagnosis ED Disposition     ED Disposition  Admit   Condition  --   Comment  [facility]ospital [facility] [[id number]]  Level of Care: Progressive [102]  Admit to Progressive based on following criteria: NEUROLOGICAL AND NEUROSURGICAL complex patients with significant risk of instability, who do not meet ICU criteria, yet require close observation or frequent assessment (< / = every 2 - 4 hours) with medical / nursing intervention.  May [facility] to [facility] or [facility] if equivalent level of care is available:: No  Covid Evaluation: Symptomatic Person Under Investigation (PUI) or recent exposure (last 10 days) *Testing Required*  Diagnosis: Seizure disorder [facility](HCC) [[id number]][[id number]]  Admitting Physician: Lurline DelHOMAS, SARA-MAIZ A [[id number]]  Attending Physician: Lurline DelHOMAS, SARA-MAIZ A [[id number]]  Certification:: I certify this patient will need inpatient services for at least 2 midnights  Estimated Length of Stay: 3          B Medical/Surgery  History Past Medical History:  Diagnosis Date   Seizure (HCC)    TBI (traumatic brain injury) [facility](HCC)    Past Surgical History:  Procedure Laterality Date   ABDOMINAL HYSTERECTOMY     CLEFT LIP REPAIR  1987   LUMBAR LAMINECTOMY/ DECOMPRESSION WITH MET-RX Right 06/19/2020   Procedure: Right Lumbar four-five Minimally invasive decompression and synovial cyst resection;  Surgeon: Jadene Pierinistergard, Thomas A, MD;  Location: [facility] OR;  Service: Neurosurgery;  Laterality: Right;     A IV Location/Drains/Wounds Patient Lines/Drains/Airways Status     Active Line/Drains/Airways     Name Placement date Placement time Site Days   Peripheral IV 07/03/22 20 G Left Wrist 07/03/22  1545  Wrist  1   Peripheral IV 07/03/22 20 G 1.16" Right Antecubital 07/03/22  2349  Antecubital  1            Intake/Output Last 24 hours  Intake/Output Summary (Last 24 hours) at 07/04/2022 1347 Last data filed at 07/04/2022 0820 Gross per 24 hour  Intake 3594.54 ml  Output --  Net 3594.54 ml    Labs/Imaging Results for orders placed or performed during the hospital encounter of 07/03/22 (from the past 48 hour(s))  Comprehensive metabolic panel     Status: Abnormal   Collection Time: 07/03/22  3:49 PM  Result Value Ref Range   Sodium 138 135 - 145 mmol/L   Potassium 4.2 3.5 - 5.1 mmol/L   Chloride 108 98 - 111 mmol/L   CO2 15 (L)  22 - 32 mmol/L   Glucose, Bld 97 70 - 99 mg/dL    Comment: Glucose reference range applies only to samples taken after fasting for at least 8 hours.   BUN 12 6 - 20 mg/dL   Creatinine, Ser 7.37 (H) 0.44 - 1.00 mg/dL   Calcium 8.8 (L) 8.9 - 10.3 mg/dL   Total Protein 6.2 (L) 6.5 - 8.1 g/dL   Albumin 3.9 3.5 - 5.0 g/dL   AST 25 15 - 41 U/L   ALT 12 0 - 44 U/L   Alkaline Phosphatase 62 38 - 126 U/L   Total Bilirubin 0.4 0.3 - 1.2 mg/dL   GFR, Estimated 53 (L) >60 mL/min    Comment: (NOTE) Calculated using the CKD-EPI Creatinine Equation (2021)    Anion gap 15 5 - 15    Comment:  Performed at [facility] Lab, [address] [place], [place] [address]  CBC with Differential/Platelet     Status: None   Collection Time: 07/03/22  3:49 PM  Result Value Ref Range   WBC 5.6 4.0 - 10.5 K/uL   RBC 4.50 3.87 - 5.11 MIL/uL   Hemoglobin 13.4 12.0 - 15.0 g/dL   HCT 59.4 70.7 - 61.5 %   MCV 94.2 80.0 - 100.0 fL   MCH 29.8 26.0 - 34.0 pg   MCHC 31.6 30.0 - 36.0 g/dL   RDW 18.3 43.7 - 35.7 %   Platelets 192 150 - 400 K/uL    Comment: REPEATED TO VERIFY   nRBC 0.0 0.0 - 0.2 %   Neutrophils Relative % 65 %   Neutro Abs 3.7 1.7 - 7.7 K/uL   Lymphocytes Relative 28 %   Lymphs Abs 1.6 0.7 - 4.0 K/uL   Monocytes Relative 5 %   Monocytes Absolute 0.3 0.1 - 1.0 K/uL   Eosinophils Relative 1 %   Eosinophils Absolute 0.0 0.0 - 0.5 K/uL   Basophils Relative 1 %   Basophils Absolute 0.1 0.0 - 0.1 K/uL   Immature Granulocytes 0 %   Abs Immature Granulocytes 0.02 0.00 - 0.07 K/uL    Comment: Performed at [facility] Lab, [address], [place], [place] [address]  Magnesium     Status: None   Collection Time: 07/03/22  3:49 PM  Result Value Ref Range   Magnesium 2.1 1.7 - 2.4 mg/dL    Comment: Performed at [facility], [address], [place], [place] [address]  CBG monitoring, ED     Status: Abnormal   Collection Time: 07/03/22  4:00 PM  Result Value Ref Range   Glucose-Capillary 105 (H) 70 - 99 mg/dL    Comment: Glucose reference range applies only to samples taken after fasting for at least 8 hours.  CBG monitoring, ED     Status: Abnormal   Collection Time: 07/03/22  5:28 PM  Result Value Ref Range   Glucose-Capillary 102 (H) 70 - 99 mg/dL    Comment: Glucose reference range applies only to samples taken after fasting for at least 8 hours.  HIV Antibody (routine testing w rflx)     Status: None   Collection Time: 07/03/22 11:15 PM  Result Value Ref Range   HIV Screen 4th Generation wRfx Non Reactive Non Reactive    Comment: Performed at [facility]  Lab, [address], [place], [place] [address]  Lactic acid, plasma     Status: None   Collection Time: 07/04/22 12:15 AM  Result Value Ref  Range   Lactic Acid, Venous 1.0 0.5 - 1.9 mmol/L    Comment: Performed at [facility] Lab, [address] [place], [place] [address]  Ethanol     Status: None   Collection Time: 07/04/22 12:15 AM  Result Value Ref Range   Alcohol, Ethyl (B) <10 <10 mg/dL    Comment: (NOTE) Lowest detectable limit for serum alcohol is 10 mg/dL.  For medical purposes only. Performed at [facility], [address], [place], [place] [address]   C-reactive protein     Status: None   Collection Time: 07/04/22 12:15 AM  Result Value Ref Range   CRP <0.5 <1.0 mg/dL    Comment: Performed at [facility] Lab, [address] [place], [place] [address]  I-Stat venous blood gas, ED     Status: Abnormal   Collection Time: 07/04/22 12:19 AM  Result Value Ref Range   pH, Ven 7.371 7.25 - 7.43   pCO2, Ven 33.4 (L) 44 - 60 mmHg   pO2, Ven 47 (H) 32 - 45 mmHg   Bicarbonate 19.4 (L) 20.0 - 28.0 mmol/L   TCO2 20 (L) 22 - 32 mmol/L   O2 Saturation 82 %   Acid-base deficit 5.0 (H) 0.0 - 2.0 mmol/L   Sodium 137 135 - 145 mmol/L   Potassium 5.0 3.5 - 5.1 mmol/L   Calcium, Ion 1.12 (L) 1.15 - 1.40 mmol/L   HCT 39.0 36.0 - 46.0 %   Hemoglobin 13.3 12.0 - 15.0 g/dL   Sample type VENOUS   Urinalysis, Routine w reflex microscopic Urine, Clean Catch     Status: Abnormal   Collection Time: 07/04/22  3:44 AM  Result Value Ref Range   Color, Urine YELLOW YELLOW   APPearance HAZY (A) CLEAR   Specific Gravity, Urine 1.012 1.005 - 1.030   pH 7.0 5.0 - 8.0   Glucose, UA NEGATIVE NEGATIVE mg/dL   Hgb urine dipstick SMALL (A) NEGATIVE   Bilirubin Urine NEGATIVE NEGATIVE   Ketones, ur 5 (A) NEGATIVE mg/dL   Protein, ur NEGATIVE NEGATIVE mg/dL   Nitrite NEGATIVE NEGATIVE   Leukocytes,Ua TRACE (A) NEGATIVE   RBC / HPF 0-5 0 - 5 RBC/hpf   WBC, UA 0-5 0 - 5 WBC/hpf   Bacteria,  UA NONE SEEN NONE SEEN   Squamous Epithelial / HPF 0-5 0 - 5 /HPF    Comment: Performed at [facility] Lab, [address] [place], [place] [address]  Rapid urine drug screen (hospital performed)     Status: Abnormal   Collection Time: 07/04/22  3:44 AM  Result Value Ref Range   Opiates NONE DETECTED NONE DETECTED   Cocaine NONE DETECTED NONE DETECTED   Benzodiazepines POSITIVE (A) NONE DETECTED   Amphetamines NONE DETECTED NONE DETECTED   Tetrahydrocannabinol NONE DETECTED NONE DETECTED   Barbiturates NONE DETECTED NONE DETECTED    Comment: (NOTE) DRUG SCREEN FOR MEDICAL PURPOSES ONLY.  IF CONFIRMATION IS NEEDED FOR ANY PURPOSE, NOTIFY LAB WITHIN 5 DAYS.  LOWEST DETECTABLE LIMITS FOR URINE DRUG SCREEN Drug Class                     Cutoff (ng/mL) Amphetamine and metabolites    1000 Barbiturate and metabolites    200 Benzodiazepine                 200 Opiates and metabolites        300 Cocaine and metabolites  300 THC                            50 Performed at [facility] Lab, [address], [place], [place] [address]   Basic metabolic panel     Status: Abnormal   Collection Time: 07/04/22  4:37 AM  Result Value Ref Range   Sodium 136 135 - 145 mmol/L   Potassium 4.0 3.5 - 5.1 mmol/L    Comment: HEMOLYSIS AT THIS LEVEL MAY AFFECT RESULT   Chloride 112 (H) 98 - 111 mmol/L   CO2 19 (L) 22 - 32 mmol/L   Glucose, Bld 94 70 - 99 mg/dL    Comment: Glucose reference range applies only to samples taken after fasting for at least 8 hours.   BUN 6 6 - 20 mg/dL   Creatinine, Ser 6.62 0.44 - 1.00 mg/dL   Calcium 8.0 (L) 8.9 - 10.3 mg/dL   GFR, Estimated >94 >76 mL/min    Comment: (NOTE) Calculated using the CKD-EPI Creatinine Equation (2021)    Anion gap 5 5 - 15    Comment: Performed at [facility] Lab, [address], [place], [place] [address]  CBC     Status: Abnormal   Collection Time: 07/04/22  4:37 AM  Result Value Ref Range   WBC 6.8 4.0 - 10.5 K/uL    RBC 3.61 (L) 3.87 - 5.11 MIL/uL   Hemoglobin 11.0 (L) 12.0 - 15.0 g/dL   HCT 35.4 (L) 65.6 - 81.2 %   MCV 88.6 80.0 - 100.0 fL   MCH 30.5 26.0 - 34.0 pg   MCHC 34.4 30.0 - 36.0 g/dL   RDW 75.1 70.0 - 17.4 %   Platelets 192 150 - 400 K/uL   nRBC 0.0 0.0 - 0.2 %    Comment: Performed at [facility] Lab, [address] [place], [place] [address]  Troponin I (High Sensitivity)     Status: None   Collection Time: 07/04/22  4:37 AM  Result Value Ref Range   Troponin I (High Sensitivity) 8 <18 ng/L    Comment: (NOTE) Elevated high sensitivity troponin I (hsTnI) values and significant  changes across serial measurements may suggest ACS but many other  chronic and acute conditions are known to elevate hsTnI results.  Refer to the "Links" section for chest pain algorithms and additional  guidance. Performed at [facility], [address], [place], [place] [address]   Lactic acid, plasma     Status: None   Collection Time: 07/04/22  7:10 AM  Result Value Ref Range   Lactic Acid, Venous 0.8 0.5 - 1.9 mmol/L    Comment: Performed at [facility], [address] [place], [place] [address]  CBG monitoring, ED     Status: None   Collection Time: 07/04/22  8:30 AM  Result Value Ref Range   Glucose-Capillary 81 70 - 99 mg/dL    Comment: Glucose reference range applies only to samples taken after fasting for at least 8 hours.   CT HEAD WO CONTRAST  Result Date: 07/04/2022 CLINICAL DATA:  Head trauma, abnormal mental status. EXAM: CT HEAD WITHOUT CONTRAST TECHNIQUE: Contiguous axial images were obtained from the base of the skull through the vertex without intravenous contrast. RADIATION DOSE REDUCTION: This exam was performed according to the departmental dose-optimization program which includes automated exposure control, adjustment of the mA and/or kV according to patient size and/or  use of iterative reconstruction technique. COMPARISON:  03/29/2021. FINDINGS: Brain: No acute  intracranial hemorrhage, midline shift or mass effect. No extra-axial fluid collection. Gray-white matter differentiation is within normal limits. No hydrocephalus. There is a stable hypodensity in the basal ganglia on the left, compatible with known prominent perivascular space. Vascular: No hyperdense vessel or unexpected calcification. Skull: Normal. Negative for fracture or focal lesion. Sinuses/Orbits: No acute finding. Other: None. IMPRESSION: No acute intracranial process. Electronically Signed   By: Brett Fairy M.D.   On: 07/04/2022 00:00   DG Chest Portable 1 View  Result Date: 07/03/2022 CLINICAL DATA:  Status post seizure. EXAM: PORTABLE CHEST 1 VIEW COMPARISON:  July 02, 2020 FINDINGS: The heart size and mediastinal contours are within normal limits. Both lungs are clear. The visualized skeletal structures are unremarkable. IMPRESSION: No active disease. Electronically Signed   By: Virgina Norfolk M.D.   On: 07/03/2022 20:47    Pending Labs Unresulted Labs (From admission, onward)     Start     Ordered   07/04/22 0930  Procalcitonin  Once,   STAT        07/04/22 0930   07/04/22 0523  Lactic acid, plasma  STAT Now then every 3 hours,   R      07/04/22 0522   07/03/22 2255  Blood gas, venous  Once,   R        07/03/22 2256            Vitals/Pain Today's Vitals   07/04/22 0930 07/04/22 1015 07/04/22 1045 07/04/22 1100  BP: (!) 95/57 (!) 89/63 95/71 (!) 88/72  Pulse: 66 61 62 68  Resp: 14 16 18 19   Temp:      TempSrc:      SpO2: 97% 97% 99% 100%  Weight:      Height:      PainSc:        Isolation Precautions No active isolations  Medications Medications  sodium chloride 0.9 % bolus 1,000 mL (0 mLs Intravenous Stopped 07/03/22 1653)    And  0.9 %  sodium chloride infusion (0 mLs Intravenous Stopped 07/04/22 0040)  lamoTRIgine (LAMICTAL) tablet 300 mg (300 mg Oral Given 07/04/22 1038)  zonisamide (ZONEGRAN) capsule 300 mg (300 mg Oral Given 07/03/22 2244)    And   zonisamide (ZONEGRAN) capsule 200 mg (200 mg Oral Given 07/04/22 1045)  lacosamide (VIMPAT) 100 mg in sodium chloride 0.9 % 25 mL IVPB ( Intravenous See Alternative 07/04/22 1038)    Or  lacosamide (VIMPAT) tablet 100 mg (100 mg Oral Given 07/04/22 1038)  LORazepam (ATIVAN) injection 1-2 mg (has no administration in time range)  ondansetron (ZOFRAN) tablet 4 mg (has no administration in time range)    Or  ondansetron (ZOFRAN) injection 4 mg (has no administration in time range)  albuterol (PROVENTIL) (2.5 MG/3ML) 0.083% nebulizer solution 2.5 mg (has no administration in time range)  DOPamine (INTROPIN) 800 mg in dextrose 5 % 250 mL (3.2 mg/mL) infusion (0 mcg/kg/min  63.5 kg Intravenous Hold 07/04/22 0609)  0.9 %  sodium chloride infusion ( Intravenous New Bag/Given 07/04/22 1036)  lacosamide (VIMPAT) 200 mg in sodium chloride 0.9 % 25 mL IVPB (0 mg Intravenous Stopped 07/03/22 1919)  LORazepam (ATIVAN) 2 MG/ML injection (2 mg  Given 07/03/22 1927)  levETIRAcetam (KEPPRA) 3,500 mg in sodium chloride 0.9 % 250 mL IVPB (0 mg Intravenous Stopped 07/03/22 2050)  ondansetron (ZOFRAN) injection 4 mg (4 mg Intravenous Given 07/03/22 2012)  0.9% NaCl  bolus PEDS (0 mLs Intravenous Stopped 07/04/22 0355)  albumin human 25 % solution 12.5 g (0 g Intravenous Stopped 07/04/22 0429)  sodium chloride 0.9 % bolus 1,000 mL (0 mLs Intravenous Stopped 07/04/22 0820)  albumin human 5 % solution 25 g ( Intravenous Restarted 07/04/22 1040)  ketorolac (TORADOL) 30 MG/ML injection 30 mg (30 mg Intravenous Given 07/04/22 1037)    Mobility Have not assessed  High fall risk   Focused Assessments Neuro Assessment Handoff:  Swallow screen pass? Yes    NIH Stroke Scale ( + Modified Stroke Scale Criteria)  LOC Questions (1b. )   +: Answers both questions correctly LOC Commands (1c. )   + : Performs both tasks correctly Best Gaze (2. )  +: Normal Visual (3. )  +: No visual loss Motor Arm, Left (5a. )   +: No drift Motor Arm, Right  (5b. )   +: No drift Motor Leg, Left (6a. )   +: No drift Motor Leg, Right (6b. )   +: No drift Sensory (8. )   +: Normal, no sensory loss Best Language (9. )   +: No aphasia Extinction/Inattention (11.)   +: No Abnormality Modified SS Total  +: 0     Neuro Assessment: Exceptions to WDL (HX OF SEIZURES AND HAS HAD 2 HERE SINCE ARRIVAL) Neuro Checks:      Last Documented NIHSS Modified Score: 0 (07/04/22 0300) Has TPA been given? No If patient is a Neuro Trauma and patient is going to OR before floor call report to [place] nurse: [phone] or [phone]   R Recommendations: See Admitting Provider Note  Report given to:   Additional Notes:

## 2022-07-04 NOTE — Progress Notes (Signed)
Paged Dr. Tana Coast to report the critical lactic acid 2.5. Waiting for return call.

## 2022-07-04 NOTE — ED Notes (Signed)
Checked patient cbg it was 94 notified patient is resting with call bell in reach and family at bedside

## 2022-07-04 NOTE — Progress Notes (Signed)
  Echocardiogram 2D Echocardiogram has been performed.  Alexis Woods 07/04/2022, 2:10 PM

## 2022-07-04 NOTE — Progress Notes (Signed)
Neurology Progress Note  Patient ID: Alexis Woods is a 57 y.o. with PMHx of  has a past medical history of Seizure ([place]) and TBI (traumatic brain injury) ([place]).  Major interval events/Subjective: Overnight was hypotensive for which she received 3 L of IV fluids as well as albumin, but she denies any symptoms She is reporting some double vision, but notes that this has happened previously in the setting of seizure and self resolved previously She is also complaining of headache which is not atypical for her after seizure Patient also notes her gabapentin was stopped due to feeling like it made her seizures worse  Exam: Vitals:   07/04/22 0615 07/04/22 0704  BP: (!) 93/58 (!) 85/56  Pulse: 65 79  Resp: 13 15  Temp:    SpO2: 100% 98%   Gen: In bed, comfortable  Resp: non-labored breathing, no grossly audible wheezing Cardiac: Perfusing extremities well  Abd: soft, nt  Neuro: MS: Awake, alert, oriented to self, situation, medications, place, following commands CN: Reporting vertical double vision in primary gaze which improves with lateral gaze bilaterally.  She does have some direction changing nystagmus.   Gait/motor: Using all 4 extremities grossly equally.  No truncal ataxia when seated.  Steady when standing with eyes closed  Pertinent Labs:  Basic Metabolic Panel: Recent Labs  Lab 07/03/22 1549 07/04/22 0019 07/04/22 0437  NA 138 137 136  K 4.2 5.0 4.0  CL 108  --  112*  CO2 15*  --  19*  GLUCOSE 97  --  94  BUN 12  --  6  CREATININE 1.20*  --  0.94  CALCIUM 8.8*  --  8.0*  MG 2.1  --   --     CBC: Recent Labs  Lab 07/03/22 1549 07/04/22 0019 07/04/22 0437  WBC 5.6  --  6.8  NEUTROABS 3.7  --   --   HGB 13.4 13.3 11.0*  HCT 42.4 39.0 32.0*  MCV 94.2  --  88.6  PLT 192  --  192    Coagulation Studies: No results for input(s): "LABPROT", "INR" in the last 72 hours.   Creatinine has improved, lactate normal at midnight and 7 AM  Impression:  Breakthrough seizures in the setting of missed medications, given the severity of her clustering with further seizures despite initial load of Vimpat, I do think continuing to bridge with Vimpat at this time is indicated.  Regarding the query of whether Vimpat could be contributing to her hypotension, family reports her blood pressure runs low at baseline, ataxia and dizziness is typically reported with Vimpat but not hypotension, and labs (though notably in the setting of aggressive volume resuscitation and albumin administration) as well as a examination (with no symptoms sitting up or standing up) are reassuring at this time.  Therefore I would not discontinue Vimpat but continue to try to mobilize her, with any further workup per primary team  Recommendations: -Continue Vimpat 100 mg twice daily until neurology follow-up, may be tapered or discontinued on an outpatient basis -EKG to confirm no adverse effects of Vimpat on cardiac conduction; notably on monitor I do not see any clear evidence of the same -Continue home lamotrigine 300 mg twice daily -Continue zonisamide 300 mg nightly and 200 mg in the morning -Ketorolac 30 mg IV once -If her double vision does not resolve, will consider MRI brain -Neurology will continue to follow  Lesleigh Noe MD-PhD [facility] [phone]  Available 7 AM to 7 PM, outside  these hours please contact Neurologist on call listed on AMION

## 2022-07-05 LAB — BASIC METABOLIC PANEL
Anion gap: 6 (ref 5–15)
BUN: 7 mg/dL (ref 6–20)
CO2: 21 mmol/L — ABNORMAL LOW (ref 22–32)
Calcium: 8.6 mg/dL — ABNORMAL LOW (ref 8.9–10.3)
Chloride: 112 mmol/L — ABNORMAL HIGH (ref 98–111)
Creatinine, Ser: 0.99 mg/dL (ref 0.44–1.00)
GFR, Estimated: >60 mL/min (ref >60)
Glucose, Bld: 90 mg/dL (ref 70–99)
Potassium: 3.6 mmol/L (ref 3.5–5.1)
Sodium: 139 mmol/L (ref 135–145)

## 2022-07-05 LAB — LACTIC ACID, PLASMA: Lactic Acid, Venous: 1.3 mmol/L (ref 0.5–1.9)

## 2022-07-05 LAB — GLUCOSE, CAPILLARY
Glucose-Capillary: 96 mg/dL (ref 70–99)
Glucose-Capillary: 78 mg/dL (ref 70–99)

## 2022-07-05 MED ORDER — LACOSAMIDE 100 MG PO TABS: 100.0000 mg | ORAL_TABLET | Freq: Two times a day (BID) | ORAL | 2 refills | Status: AC

## 2022-07-05 NOTE — Progress Notes (Signed)
Discharge instructions given to patient and family. All questions answered.  

## 2022-07-05 NOTE — Progress Notes (Addendum)
Neurology Progress Note  Patient ID: Alexis Woods is a 57 y.o. with PMHx of  has a past medical history of Seizure ([place]) and TBI (traumatic brain injury) ([place]).  Major interval events/Subjective: No acute complaints, notes her double vision has resolved AKI from admission has resolved, she did have a briefly elevated lactate but this resolved  Exam: Vitals:   07/05/22 0402 07/05/22 0737  BP: 109/68 (!) 96/58  Pulse: 69 65  Resp: 16 18  Temp: 98.4 F (36.9 C) 98.7 F (37.1 C)  SpO2: 100% 99%   Ambulating with physical therapy.  Awake, alert, appropriately conversant, fluent speech, good recall of recent events Face symmetric, diplopia resolved, tracking examiner well Using all 4 extremities equally Stable stance, normal casual gait  Pertinent Labs:  Basic Metabolic Panel: Recent Labs  Lab 07/03/22 1549 07/04/22 0019 07/04/22 0437  NA 138 137 136  K 4.2 5.0 4.0  CL 108  --  112*  CO2 15*  --  19*  GLUCOSE 97  --  94  BUN 12  --  6  CREATININE 1.20*  --  0.94  CALCIUM 8.8*  --  8.0*  MG 2.1  --   --      CBC: Recent Labs  Lab 07/03/22 1549 07/04/22 0019 07/04/22 0437  WBC 5.6  --  6.8  NEUTROABS 3.7  --   --   HGB 13.4 13.3 11.0*  HCT 42.4 39.0 32.0*  MCV 94.2  --  88.6  PLT 192  --  192    Coagulation Studies: No results for input(s): "LABPROT", "INR" in the last 72 hours.   EKG personally reviewed, no evidence of heart block  Creatinine has improved, lactate normal at midnight and 7 AM  Impression: Breakthrough seizures in the setting of missed medications, given the severity of her clustering with further seizures despite initial load of Vimpat, my colleague recommended continuing Vimpat bridge at discharge and as the patient seems to be tolerating this well without any significant side effects, I agree that it should be continued for now  Recommendations: -Continue Vimpat 100 mg twice daily until neurology follow-up, may be tapered or discontinued  on an outpatient basis versus discontinuing one of her other antiseizure medications -Continue home lamotrigine 300 mg twice daily -Continue zonisamide 300 mg nightly and 200 mg in the morning -Seizure precautions reviewed with patient, she notes she has been driving but understands that she cannot at this time until seizure control is confirmed.  Full seizure precautions below should be included in discharge instructions -Neurology will be available as needed going forward, reach out if any other questions or concerns arise  Standard seizure precautions: Per [facility] statutes, patients with seizures are not allowed to drive until  they have been seizure-free for six months. Use caution when using heavy equipment or power tools. Avoid working on ladders or at heights. Take showers instead of baths. Ensure the water temperature is not too high on the home water heater. Do not go swimming alone. When caring for infants or small children, sit down when holding, feeding, or changing them to minimize risk of injury to the child in the event you have a seizure.  To reduce risk of seizures, maintain good sleep hygiene avoid alcohol and illicit drug use, take all anti-seizure medications as prescribed.   Lesleigh Noe MD-PhD [facility] [phone]  Available 7 AM to 7 PM, outside these hours please contact Neurologist on call listed on AMION  Recommendations  conveyed to primary team via secure chat

## 2022-07-05 NOTE — Plan of Care (Signed)

## 2022-07-05 NOTE — Discharge Summary (Signed)
Physician Discharge Summary  Alexis Woods TDD:[id number] DOB: Dec 05, 1965 DOA: 07/03/2022  PCP: Everlean Cherry, MD  Admit date: 07/03/2022 Discharge date: 07/05/2022  Admitted From: Home Disposition: Home  Recommendations for Outpatient Follow-up:  Follow up with PCP in 1-2 weeks Follow-up with neurology, Dr. Ulice Bold in 1-2 weeks Started on Vimpat 100 mg p.o. twice daily Per [facility] neurology, Continue Vimpat 100 mg twice daily until neurology follow-up, may be tapered or discontinued on an outpatient basis versus discontinuing one of her other antiseizure medications   Home Health: No Equipment/Devices: None  Discharge Condition: Stable CODE STATUS: Full code Diet recommendation: Regular diet  History of present illness:  Alexis Woods is a 57 year old female with past medical history significant for TBI, seizure disorder on Lamictal and Zonegran who presented to [facility], ED 1/4 with altered mental status.  Patient was noted to have multiple breakthrough seizures.  Patient apparently missed 2 doses of her seizure medication.  EMS was called and patient was noted to have 2 further episodes of seizure-like activity and was given Versed.  On arrival to the ED, patient was noted to be postictal.  Family reports last seizure was roughly 14 months prior.  Neurology was consulted.  TR[facility] consulted for further evaluation management of breakthrough seizure likely secondary to medication noncompliance.   Hospital course:  Seizure disorder with recurrent breakthrough seizures Hx TBI Patient presenting to ED after recurrent seizures.  Apparently missed 2 doses of her seizure medication.  Received Versed and route by EMS.  CT head with no acute intracranial findings.  Neurology was consulted and given Vimpat load and continued on Zonegran and Lamictal.  Neurology recommends continuing Vimpat 100 mg p.o. twice daily.  Continue home lamotrigine 300 mg twice daily and zonisamide 300 mg nightly and 200 mg in the  morning . Continue Vimpat 100 mg twice daily until neurology follow-up, may be tapered or discontinued on an outpatient basis versus discontinuing one of her other antiseizure medications.  Standard seizure precautions: Per [facility] statutes, patients with seizures are not allowed to drive until  they have been seizure-free for six months. Use caution when using heavy equipment or power tools. Avoid working on ladders or at heights. Take showers instead of baths. Ensure the water temperature is not too high on the home water heater. Do not go swimming alone. When caring for infants or small children, sit down when holding, feeding, or changing them to minimize risk of injury to the child in the event you have a seizure. To reduce risk of seizures, maintain good sleep hygiene avoid alcohol and illicit drug use, take all anti-seizure medications as prescribed.   Hypotension: Resolved Patient noted to be hypotensive on admission, likely secondary to medication effect with benzodiazepines.  Received IV fluid hydration with improvement of blood pressure.  Discharge Diagnoses:  Principal Problem:   Seizure disorder [facility]) Active Problems:   TBI (traumatic brain injury) ([facility])   Hypotension    Discharge Instructions  Discharge Instructions     Call MD for:  difficulty breathing, headache or visual disturbances   Complete by: As directed    Call MD for:  extreme fatigue   Complete by: As directed    Call MD for:  persistant dizziness or light-headedness   Complete by: As directed    Call MD for:  persistant nausea and vomiting   Complete by: As directed    Call MD for:  severe uncontrolled pain   Complete by: As directed  Call MD for:  temperature >100.4   Complete by: As directed    Diet - low sodium heart healthy   Complete by: As directed    Increase activity slowly   Complete by: As directed       Allergies as of 07/05/2022       Reactions   Codeine Nausea And Vomiting    Oxycodone Other (See Comments)   Mental Status Changes    Sertraline Nausea And Vomiting   Gluten Meal Swelling, Rash, Other (See Comments)   Top lip swells         Medication List     TAKE these medications    acetaminophen 500 MG tablet Commonly known as: TYLENOL Take 500 mg by mouth every 6 (six) hours as needed for mild pain or headache.   bacitracin-polymyxin b ointment Commonly known as: POLYSPORIN Apply 1 application topically daily as needed (wound care).   Lacosamide 100 MG Tabs Take 1 tablet (100 mg total) by mouth 2 (two) times daily.   lamoTRIgine 200 MG tablet Commonly known as: LaMICtal Take 1 tablet (200 mg total) by mouth 2 (two) times daily. What changed: additional instructions   lamoTRIgine 100 MG tablet Commonly known as: LAMICTAL Take 100 mg by mouth 2 (two) times daily. Taking with the 200 mg = 300 mg BID What changed: Another medication with the same name was changed. Make sure you understand how and when to take each.   Nayzilam 5 MG/0.1ML Soln Generic drug: Midazolam Use for protracted seizure, 1 spray in one nostril What changed:  how to take this when to take this additional instructions   sodium chloride 0.65 % Soln nasal spray Commonly known as: OCEAN Place 1 spray into both nostrils as needed for congestion.   zonisamide 100 MG capsule Commonly known as: ZONEGRAN Take 200-300 mg by mouth See admin instructions. Take 2 capsules ( 200 mg) in the AM and 3 capsules (300 mg) in the evening        Follow-up Information     Daylene Posey, MD. Schedule an appointment as soon as possible for a visit in 1 week(s).   Specialty: Neurology Contact information: [address] [place] [address] [phone]                Allergies  Allergen Reactions   Codeine Nausea And Vomiting   Oxycodone Other (See Comments)    Mental Status Changes     Sertraline Nausea And Vomiting   Gluten Meal Swelling, Rash and  Other (See Comments)    Top lip swells     Consultations: Neurology   Procedures/Studies: ECHOCARDIOGRAM COMPLETE  Result Date: 07/04/2022    ECHOCARDIOGRAM REPORT   Patient Name:   Alexis Woods Date of Exam: 07/04/2022 Medical Rec #:  [id number]   Height:       66.5 in Accession #:    [id number]  Weight:       140.0 lb Date of Birth:  1965-11-29    BSA:          1.728 m Patient Age:    69 years    BP:           91/61 mmHg Patient Gender: F           HR:           60 bpm. Exam Location:  Inpatient Procedure: 2D Echo, Cardiac Doppler and Color Doppler Indications:    Other abnormalities of the heart  R00.8  History:        Patient has no prior history of Echocardiogram examinations.                 TBI; Risk Factors:Non-Smoker.  Sonographer:    Greer Pickerel Referring Phys: Clance Boll  Sonographer Comments: Image acquisition challenging due to respiratory motion. IMPRESSIONS  1. Left ventricular ejection fraction, by estimation, is 60 to 65%. The left ventricle has normal function. The left ventricle has no regional wall motion abnormalities. There is mild left ventricular hypertrophy. Left ventricular diastolic parameters were normal.  2. Right ventricular systolic function is normal. The right ventricular size is normal. There is normal pulmonary artery systolic pressure.  3. No evidence of mitral valve regurgitation.  4. The aortic valve is grossly normal. Aortic valve regurgitation is not visualized.  5. The inferior vena cava is dilated in size with <50% respiratory variability, suggesting right atrial pressure of 15 mmHg. Conclusion(s)/Recommendation(s): Normal biventricular function without evidence of hemodynamically significant valvular heart disease. FINDINGS  Left Ventricle: Left ventricular ejection fraction, by estimation, is 60 to 65%. The left ventricle has normal function. The left ventricle has no regional wall motion abnormalities. The left ventricular internal cavity size was normal in  size. There is  mild left ventricular hypertrophy. Left ventricular diastolic parameters were normal. Right Ventricle: The right ventricular size is normal. Right ventricular systolic function is normal. There is normal pulmonary artery systolic pressure. The tricuspid regurgitant velocity is 1.67 m/s, and with an assumed right atrial pressure of 15 mmHg, the estimated right ventricular systolic pressure is 35.3 mmHg. Left Atrium: Left atrial size was normal in size. Right Atrium: Right atrial size was normal in size. Pericardium: There is no evidence of pericardial effusion. Mitral Valve: No evidence of mitral valve regurgitation. Tricuspid Valve: Tricuspid valve regurgitation is trivial. Aortic Valve: The aortic valve is grossly normal. Aortic valve regurgitation is not visualized. Pulmonic Valve: Pulmonic valve regurgitation is not visualized. Aorta: The aortic root and ascending aorta are structurally normal, with no evidence of dilitation. Venous: The inferior vena cava is dilated in size with less than 50% respiratory variability, suggesting right atrial pressure of 15 mmHg. IAS/Shunts: No atrial level shunt detected by color flow Doppler.  LEFT VENTRICLE PLAX 2D LVIDd:         3.40 cm   Diastology LVIDs:         2.20 cm   LV e' medial:    10.20 cm/s LV PW:         0.90 cm   LV E/e' medial:  6.8 LV IVS:        1.10 cm   LV e' lateral:   12.00 cm/s LVOT diam:     1.90 cm   LV E/e' lateral: 5.8 LV SV:         63 LV SV Index:   36 LVOT Area:     2.84 cm  RIGHT VENTRICLE RV S prime:     13.60 cm/s TAPSE (M-mode): 2.3 cm LEFT ATRIUM           Index        RIGHT ATRIUM           Index LA diam:      3.20 cm 1.85 cm/m   RA Area:     13.30 cm LA Vol (A2C): 42.7 ml 24.71 ml/m  RA Volume:   30.20 ml  17.48 ml/m LA Vol (A4C): 58.4 ml 33.79 ml/m  AORTIC VALVE  PULMONIC VALVE LVOT Vmax:   123.00 cm/s PR End Diast Vel: 1.49 msec LVOT Vmean:  70.700 cm/s LVOT VTI:    0.221 m  AORTA Ao Root diam: 3.10 cm Ao  Asc diam:  2.90 cm MITRAL VALVE               TRICUSPID VALVE MV Area (PHT): 2.24 cm    TR Peak grad:   11.2 mmHg MV Decel Time: 338 msec    TR Vmax:        167.00 cm/s MR Peak grad: 4.1 mmHg MR Vmax:      101.00 cm/s  SHUNTS MV E velocity: 69.70 cm/s  Systemic VTI:  0.22 m MV A velocity: 41.40 cm/s  Systemic Diam: 1.90 cm MV E/A ratio:  1.68 Mary Scientist, physiological signed by Phineas Inches Signature Date/Time: 07/04/2022/2:23:58 PM    Final    CT HEAD WO CONTRAST  Result Date: 07/04/2022 CLINICAL DATA:  Head trauma, abnormal mental status. EXAM: CT HEAD WITHOUT CONTRAST TECHNIQUE: Contiguous axial images were obtained from the base of the skull through the vertex without intravenous contrast. RADIATION DOSE REDUCTION: This exam was performed according to the departmental dose-optimization program which includes automated exposure control, adjustment of the mA and/or kV according to patient size and/or use of iterative reconstruction technique. COMPARISON:  03/29/2021. FINDINGS: Brain: No acute intracranial hemorrhage, midline shift or mass effect. No extra-axial fluid collection. Gray-white matter differentiation is within normal limits. No hydrocephalus. There is a stable hypodensity in the basal ganglia on the left, compatible with known prominent perivascular space. Vascular: No hyperdense vessel or unexpected calcification. Skull: Normal. Negative for fracture or focal lesion. Sinuses/Orbits: No acute finding. Other: None. IMPRESSION: No acute intracranial process. Electronically Signed   By: Brett Fairy M.D.   On: 07/04/2022 00:00   DG Chest Portable 1 View  Result Date: 07/03/2022 CLINICAL DATA:  Status post seizure. EXAM: PORTABLE CHEST 1 VIEW COMPARISON:  July 02, 2020 FINDINGS: The heart size and mediastinal contours are within normal limits. Both lungs are clear. The visualized skeletal structures are unremarkable. IMPRESSION: No active disease. Electronically Signed   By: Virgina Norfolk M.D.    On: 07/03/2022 20:47     Subjective: Patient seen examined bedside, resting calmly.  Eating breakfast.  No complaints this morning.  Seen by neurology and now signed off.  Recommend continuing Vimpat on discharge as well as her other antiseizure medications.  Will need close outpatient follow-up with her neurologist to consider tapering of the Vimpat versus continue long-term.  No other specific questions or concerns at this time.  Denies headache, no visual changes, no chest pain, no palpitations, no shortness of breath, no fever/chills/night sweats, no nausea cefonicid diarrhea, no focal weakness, no fatigue, no abdominal pain, no paresthesias.  No acute events overnight per nursing staff.  Discharge Exam: Vitals:   07/05/22 0402 07/05/22 0737  BP: 109/68 (!) 96/58  Pulse: 69 65  Resp: 16 18  Temp: 98.4 F (36.9 C) 98.7 F (37.1 C)  SpO2: 100% 99%   Vitals:   07/04/22 2021 07/05/22 0019 07/05/22 0402 07/05/22 0737  BP: 96/65 106/69 109/68 (!) 96/58  Pulse: 74 71 69 65  Resp: 15 14 16 18   Temp: 98.8 F (37.1 C) 98.5 F (36.9 C) 98.4 F (36.9 C) 98.7 F (37.1 C)  TempSrc: Oral Oral Oral Oral  SpO2: 100% 100% 100% 99%  Weight:      Height:        Physical  Exam: GEN: NAD, alert and oriented x 3, wd/wn HEENT: NCAT, PERRL, EOMI, sclera clear, MMM PULM: CTAB w/o wheezes/crackles, normal respiratory effort, on room air CV: RRR w/o M/G/R GI: abd soft, NTND, NABS, no R/G/M MSK: no peripheral edema, muscle strength globally intact 5/5 bilateral upper/lower extremities NEURO: CN II-XII intact, no focal deficits, sensation to light touch intact PSYCH: normal mood/affect Integumentary: dry/intact, no rashes or wounds    The results of significant diagnostics from this hospitalization (including imaging, microbiology, ancillary and laboratory) are listed below for reference.     Microbiology: No results found for this or any previous visit (from the past 240 hour(s)).    Labs: BNP (last 3 results) No results for input(s): "BNP" in the last 8760 hours. Basic Metabolic Panel: Recent Labs  Lab 07/03/22 1549 07/04/22 0019 07/04/22 0437 07/05/22 1011  NA 138 137 136 139  K 4.2 5.0 4.0 3.6  CL 108  --  112* 112*  CO2 15*  --  19* 21*  GLUCOSE 97  --  94 90  BUN 12  --  6 7  CREATININE 1.20*  --  0.94 0.99  CALCIUM 8.8*  --  8.0* 8.6*  MG 2.1  --   --   --    Liver Function Tests: Recent Labs  Lab 07/03/22 1549  AST 25  ALT 12  ALKPHOS 62  BILITOT 0.4  PROT 6.2*  ALBUMIN 3.9   No results for input(s): "LIPASE", "AMYLASE" in the last 168 hours. No results for input(s): "AMMONIA" in the last 168 hours. CBC: Recent Labs  Lab 07/03/22 1549 07/04/22 0019 07/04/22 0437  WBC 5.6  --  6.8  NEUTROABS 3.7  --   --   HGB 13.4 13.3 11.0*  HCT 42.4 39.0 32.0*  MCV 94.2  --  88.6  PLT 192  --  192   Cardiac Enzymes: No results for input(s): "CKTOTAL", "CKMB", "CKMBINDEX", "TROPONINI" in the last 168 hours. BNP: Invalid input(s): "POCBNP" CBG: Recent Labs  Lab 07/03/22 1728 07/04/22 0830 07/04/22 1703 07/04/22 2209 07/05/22 0610  GLUCAP 102* 81 87 106* 96   D-Dimer No results for input(s): "DDIMER" in the last 72 hours. Hgb A1c No results for input(s): "HGBA1C" in the last 72 hours. Lipid Profile No results for input(s): "CHOL", "HDL", "LDLCALC", "TRIG", "CHOLHDL", "LDLDIRECT" in the last 72 hours. Thyroid function studies No results for input(s): "TSH", "T4TOTAL", "T3FREE", "THYROIDAB" in the last 72 hours.  Invalid input(s): "FREET3" Anemia work up No results for input(s): "VITAMINB12", "FOLATE", "FERRITIN", "TIBC", "IRON", "RETICCTPCT" in the last 72 hours. Urinalysis    Component Value Date/Time   COLORURINE YELLOW 07/04/2022 0344   APPEARANCEUR HAZY (A) 07/04/2022 0344   LABSPEC 1.012 07/04/2022 0344   PHURINE 7.0 07/04/2022 0344   GLUCOSEU NEGATIVE 07/04/2022 0344   HGBUR SMALL (A) 07/04/2022 0344   BILIRUBINUR  NEGATIVE 07/04/2022 0344   KETONESUR 5 (A) 07/04/2022 0344   PROTEINUR NEGATIVE 07/04/2022 0344   NITRITE NEGATIVE 07/04/2022 0344   LEUKOCYTESUR TRACE (A) 07/04/2022 0344   Sepsis Labs Recent Labs  Lab 07/03/22 1549 07/04/22 0437  WBC 5.6 6.8   Microbiology No results found for this or any previous visit (from the past 240 hour(s)).   Time coordinating discharge: Over 30 minutes  SIGNED:   Donnamarie Poag [place] DO  [facility] 07/05/2022, 11:22 AM

## 2022-07-05 NOTE — Discharge Instructions (Addendum)
Recommendations for Outpatient Follow-up:  Follow up with PCP in 1-2 weeks Follow-up with neurology, Dr. Standley Brooking in 1-2 weeks Started on Vimpat 100 mg p.o. twice daily Per [facility], Continue Vimpat 100 mg twice daily until neurology follow-up, may be tapered or discontinued on an outpatient basis versus discontinuing one of her other antiseizure medications    Continue Vimpat 100 mg twice daily until neurology follow-up, may be tapered or discontinued on an outpatient basis versus discontinuing one of her other antiseizure medications. Continue home lamotrigine 300 mg twice daily, Continue zonisamide 300 mg nightly and 200 mg in the morning  Standard seizure precautions: Per [facility] statutes, patients with seizures are not allowed to drive until  they have been seizure-free for six months. Use caution when using heavy equipment or power tools. Avoid working on ladders or at heights. Take showers instead of baths. Ensure the water temperature is not too high on the home water heater. Do not go swimming alone. When caring for infants or small children, sit down when holding, feeding, or changing them to minimize risk of injury to the child in the event you have a seizure. To reduce risk of seizures, maintain good sleep hygiene avoid alcohol and illicit drug use, take all anti-seizure medications as prescribed.

## 2022-07-05 NOTE — Evaluation (Signed)
Physical Therapy Evaluation and Discharge Patient Details Name: Alexis Woods MRN: [id number] DOB: [birth year] Today's Date: 07/05/2022  History of Present Illness  57 y.o. female who presents to ED 07/03/21 BIB EMS after having multiple breakthrough seizures at home. PMH significant of TBI, Seizure d/o  Clinical Impression   Patient evaluated by Physical Therapy with no further acute PT needs identified. Patient is independent with all mobility with no balance deficits.  PT is signing off. Thank you for this referral.        Recommendations for follow up therapy are one component of a multi-disciplinary discharge planning process, led by the attending physician.  Recommendations may be updated based on patient status, additional functional criteria and insurance authorization.  Follow Up Recommendations No PT follow up      Assistance Recommended at Discharge None  Patient can return home with the following       Equipment Recommendations None recommended by PT  Recommendations for Other Services       Functional Status Assessment Patient has not had a recent decline in their functional status     Precautions / Restrictions Precautions Precautions: Other (comment) Precaution Comments: seizure      Mobility  Bed Mobility Overal bed mobility: Independent                  Transfers Overall transfer level: Independent Equipment used: None                    Ambulation/Gait Ambulation/Gait assistance: Independent Gait Distance (Feet): 170 Feet Assistive device: None Gait Pattern/deviations: WFL(Within Functional Limits)   Gait velocity interpretation: >4.37 ft/sec, indicative of normal walking speed   General Gait Details: including head turns, changes in direction, sudden stops  Science writer    Modified Rankin (Stroke Patients Only)       Balance Overall balance assessment: Independent                                            Pertinent Vitals/Pain Pain Assessment Pain Assessment: No/denies pain    Home Living Family/patient expects to be discharged to:: Private residence Living Arrangements: Spouse/significant other;Children (son) Available Help at Discharge: Family;Available PRN/intermittently (gone from 6:30-7) Type of Home: House Home Access: Stairs to enter Entrance Stairs-Rails: None Entrance Stairs-Number of Steps: 5   Home Layout: One level Home Equipment: Grab bars - tub/shower      Prior Function Prior Level of Function : Independent/Modified Independent                     Hand Dominance        Extremity/Trunk Assessment   Upper Extremity Assessment Upper Extremity Assessment: Overall WFL for tasks assessed    Lower Extremity Assessment Lower Extremity Assessment: Overall WFL for tasks assessed    Cervical / Trunk Assessment Cervical / Trunk Assessment: Normal  Communication   Communication: No difficulties  Cognition Arousal/Alertness: Awake/alert Behavior During Therapy: WFL for tasks assessed/performed Overall Cognitive Status: Within Functional Limits for tasks assessed                                          General Comments  Exercises     Assessment/Plan    PT Assessment Patient does not need any further PT services  PT Problem List         PT Treatment Interventions      PT Goals (Current goals can be found in the Care Plan section)  Acute Rehab PT Goals Patient Stated Goal: return hom PT Goal Formulation: All assessment and education complete, DC therapy    Frequency       Co-evaluation               AM-PAC PT "6 Clicks" Mobility  Outcome Measure Help needed turning from your back to your side while in a flat bed without using bedrails?: None Help needed moving from lying on your back to sitting on the side of a flat bed without using bedrails?: None Help needed moving to and  from a bed to a chair (including a wheelchair)?: None Help needed standing up from a chair using your arms (e.g., wheelchair or bedside chair)?: None Help needed to walk in hospital room?: None Help needed climbing 3-5 steps with a railing? : None 6 Click Score: 24    End of Session   Activity Tolerance: Patient tolerated treatment well Patient left: in bed;with call bell/phone within reach;with bed alarm set Nurse Communication: Mobility status;Other (comment) (no PT needs) PT Visit Diagnosis: Other symptoms and signs involving the nervous system (H47.425)    Time: 9563-8756 PT Time Calculation (min) (ACUTE ONLY): 15 min   Charges:   PT Evaluation $PT Eval Low Complexity: 1 Low           Jerolyn Center, PT Acute Rehabilitation Services  Office [phone]   Zena Amos 07/05/2022, 10:41 AM

## 2023-04-01 IMAGING — CT CT HEAD W/O CM
4 series · 17 of 47 positions shown, 19 images · non-contrast
Comparison: Comparison made with July 02, 2020.

CLINICAL DATA: A 55-year-old female presents with mental status
changes.

EXAM:
CT HEAD WITHOUT CONTRAST
TECHNIQUE: Contiguous axial images were obtained from the base of the skull
through the vertex without intravenous contrast.

[Series 3: head without · axial · non-contrast · 0.39mm/px · z∈[+929,+1039]mm · 7 of 30 slices shown, 9 images]
[im 4/30  brain]
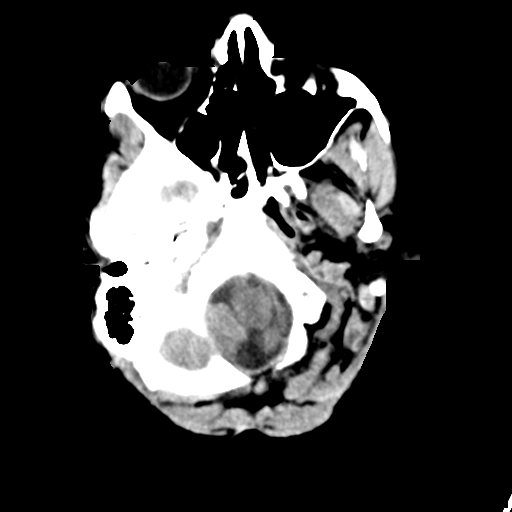
[im 4/30  bone]
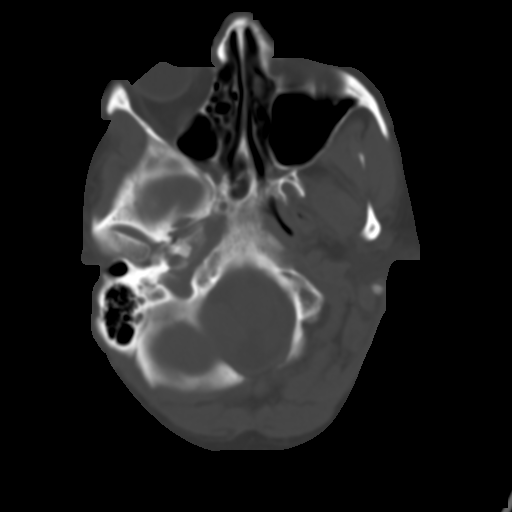
[im 8/30  brain]
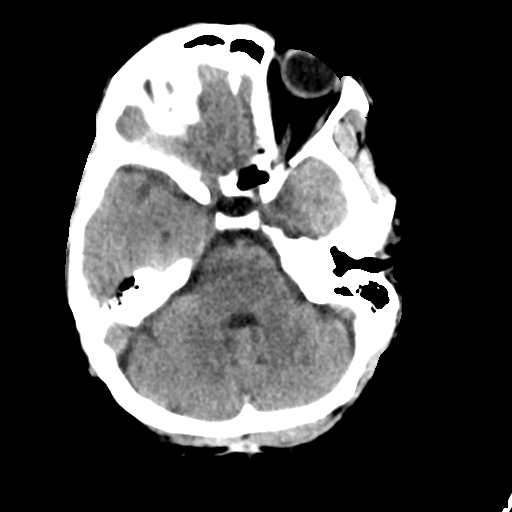
[im 11/30  brain]
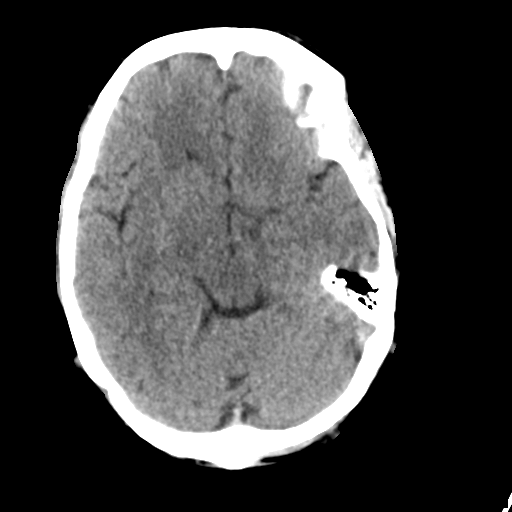
[im 15/30  brain]
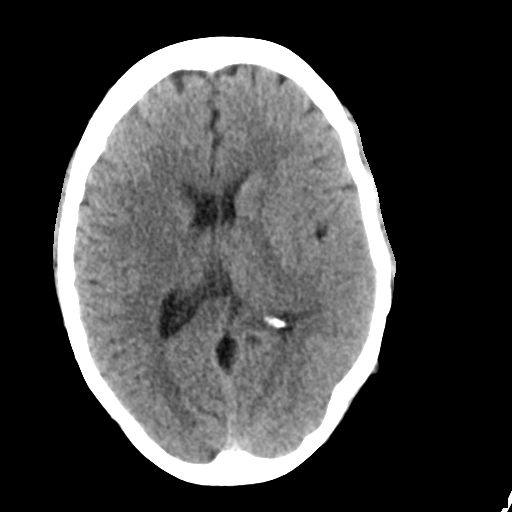
[im 19/30  brain]
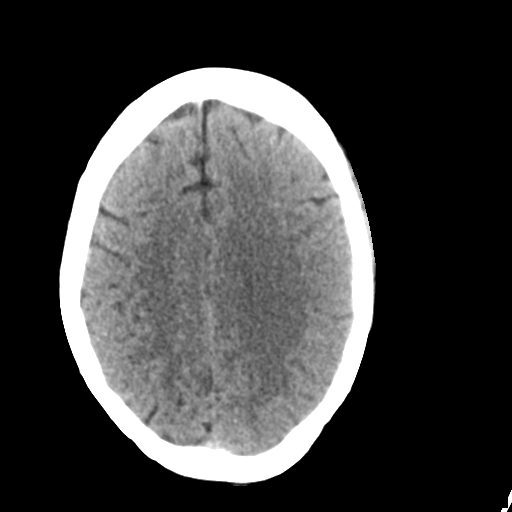
[im 19/30  bone]
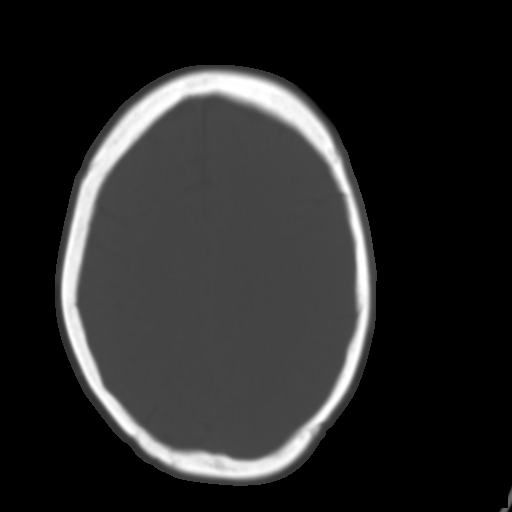
[im 22/30  brain]
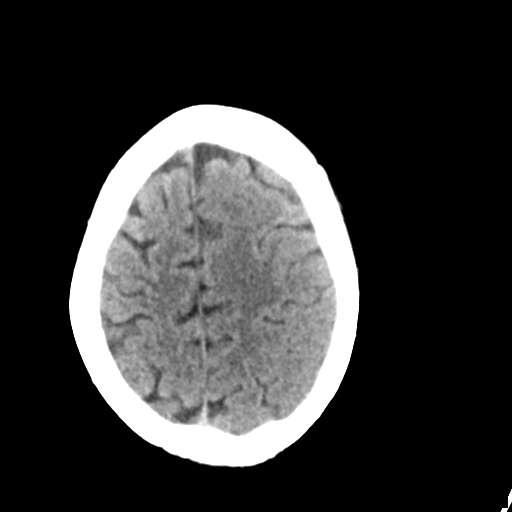
[im 26/30  brain]
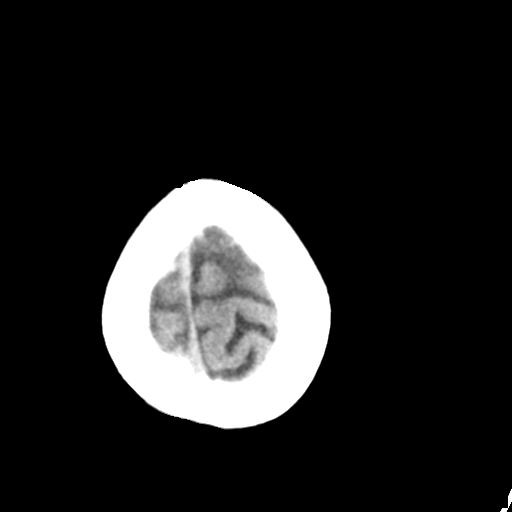

[Series 4: head bone · axial · 0.39mm/px · z∈[+928,+980]mm · 4 of 75 slices shown]
[im 8/75  bone]
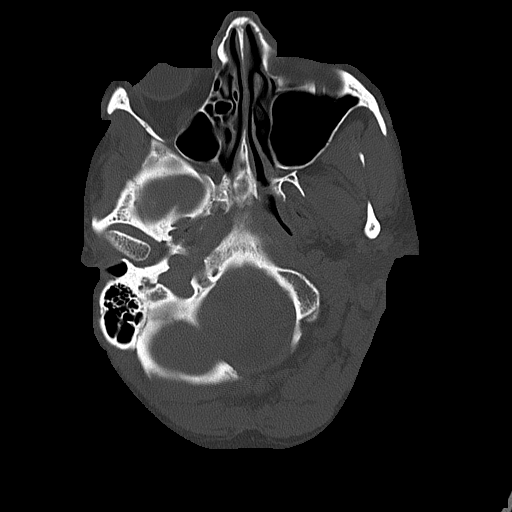
[im 15/75  bone]
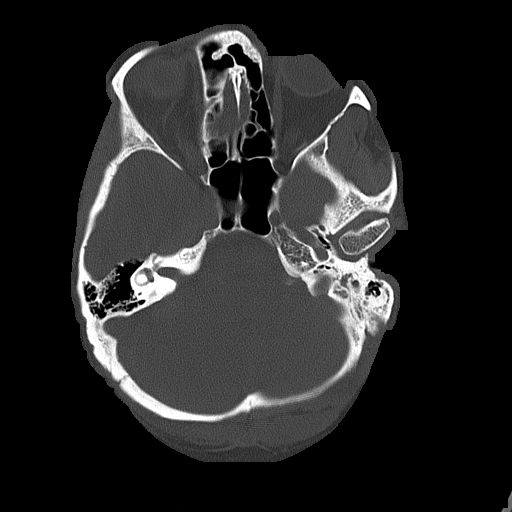
[im 23/75  bone]
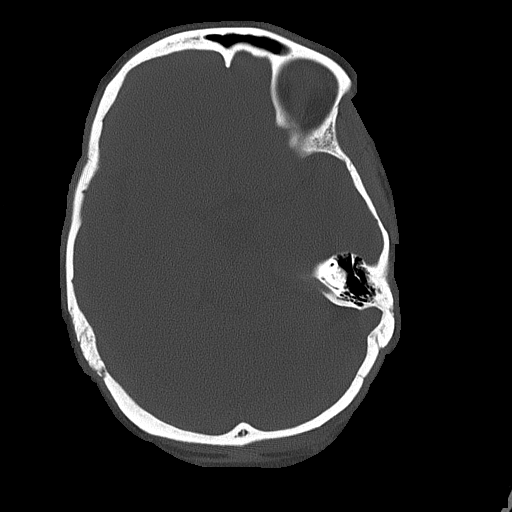
[im 34/75  bone]
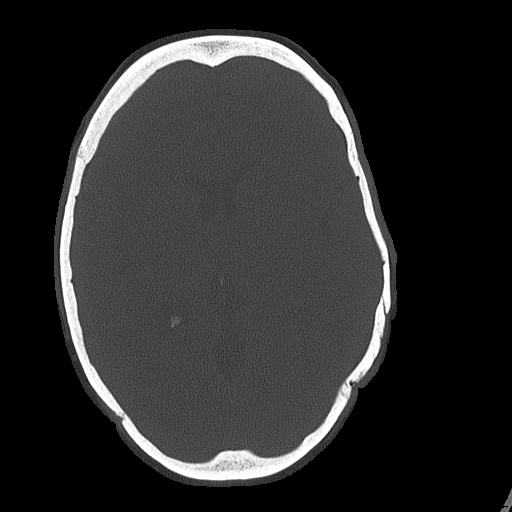

[Series 5: head without sag · sagittal · non-contrast · 0.30mm/px · 3 of 53 slices shown]
[im 22/53  brain]
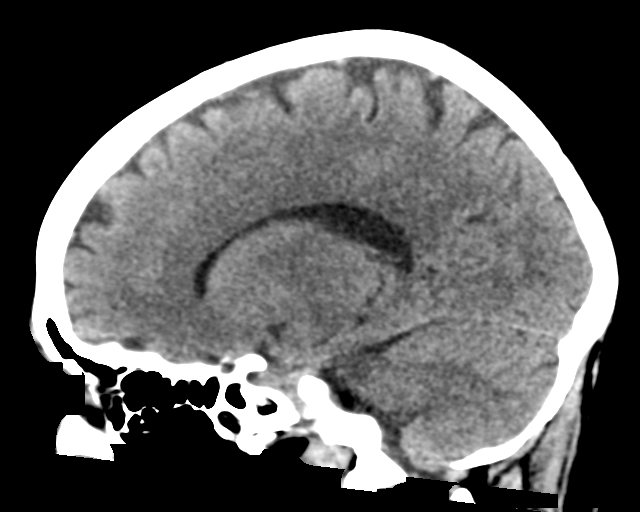
[im 27/53  brain]
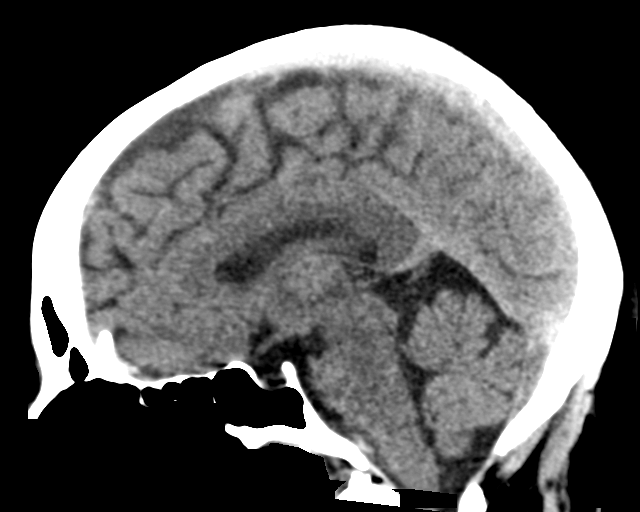
[im 31/53  brain]
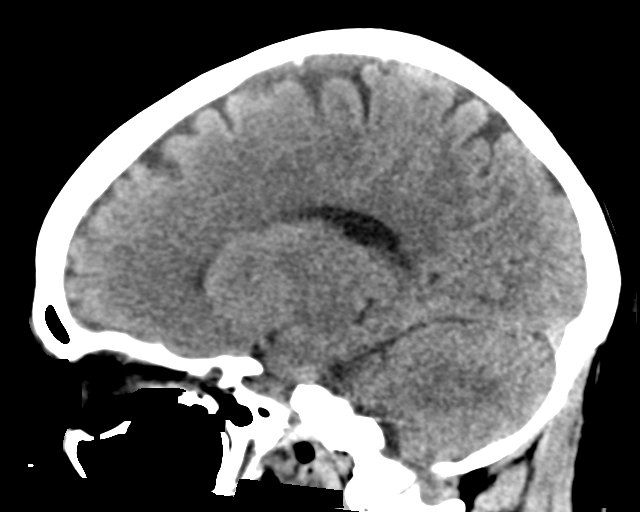

[Series 6: head without cor · coronal · non-contrast · 0.30mm/px · 3 of 67 slices shown]
[im 23/67  brain]
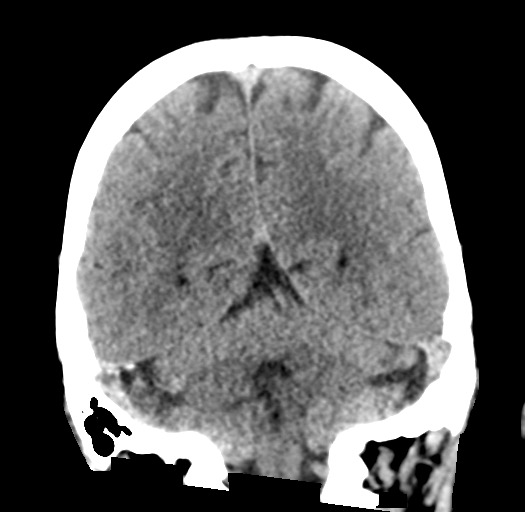
[im 30/67  brain]
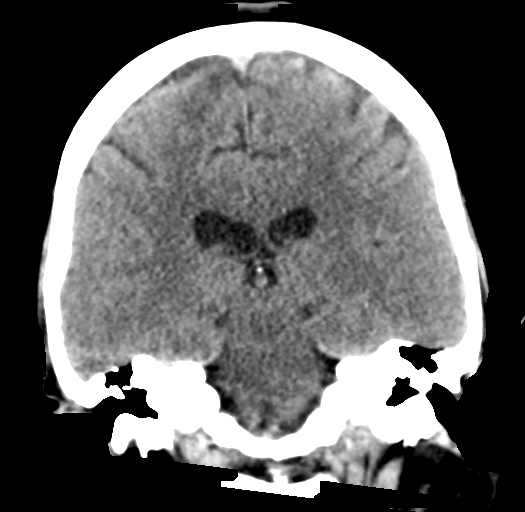
[im 37/67  brain]
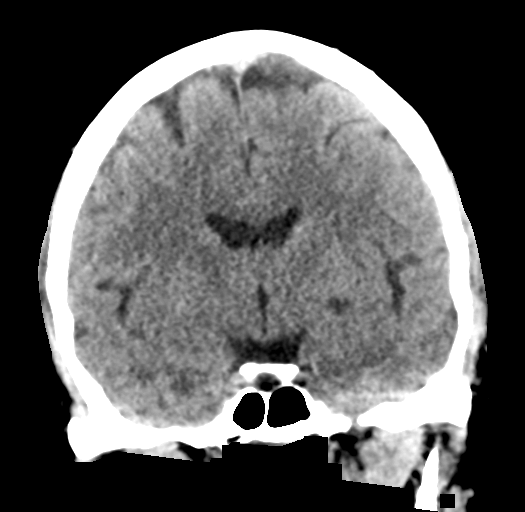

[17 of 47 positions shown; findings below may reference images not displayed]

FINDINGS: Brain: No evidence of acute infarction, hemorrhage, hydrocephalus,
extra-axial collection or mass lesion/mass effect.

Vascular: No hyperdense vessel or unexpected calcification.

Skull: Normal. Negative for fracture or focal lesion.

Sinuses/Orbits: Visualized paranasal sinuses and orbits are
unremarkable.

Other: None
IMPRESSION: No acute intracranial abnormality.
